# Patient Record
Sex: Female | Born: 1945 | Race: White | Hispanic: No | Smoking: Current every day smoker
Health system: Southern US, Community
[De-identification: ages and names within clinical notes are randomized; demographics above are authoritative.]

## PROBLEM LIST (undated history)

## (undated) DIAGNOSIS — E7800 Pure hypercholesterolemia, unspecified: Secondary | ICD-10-CM

## (undated) DIAGNOSIS — I10 Essential (primary) hypertension: Secondary | ICD-10-CM

---

## 2011-10-29 ENCOUNTER — Encounter (HOSPITAL_COMMUNITY): Payer: Self-pay | Admitting: *Deleted

## 2011-10-29 ENCOUNTER — Encounter (HOSPITAL_COMMUNITY): Payer: Self-pay | Admitting: Anesthesiology

## 2011-10-29 ENCOUNTER — Encounter (HOSPITAL_COMMUNITY)
Admission: EM | Disposition: A | Discharge status: Home / Self Care | Payer: Self-pay | Source: Home / Self Care | Attending: Emergency Medicine

## 2011-10-29 ENCOUNTER — Emergency Department (HOSPITAL_COMMUNITY)
Admission: EM | Admit: 2011-10-29 | Discharge: 2011-10-29 | Disposition: A | Discharge status: Home / Self Care | Payer: Managed Care, Other (non HMO) | Attending: Emergency Medicine | Admitting: Emergency Medicine

## 2011-10-29 ENCOUNTER — Emergency Department (HOSPITAL_COMMUNITY): Payer: Managed Care, Other (non HMO) | Admitting: Anesthesiology

## 2011-10-29 DIAGNOSIS — L0390 Cellulitis, unspecified: Secondary | ICD-10-CM

## 2011-10-29 DIAGNOSIS — S6990XA Unspecified injury of unspecified wrist, hand and finger(s), initial encounter: Secondary | ICD-10-CM | POA: Insufficient documentation

## 2011-10-29 DIAGNOSIS — M7989 Other specified soft tissue disorders: Secondary | ICD-10-CM | POA: Insufficient documentation

## 2011-10-29 DIAGNOSIS — I10 Essential (primary) hypertension: Secondary | ICD-10-CM | POA: Insufficient documentation

## 2011-10-29 DIAGNOSIS — M79609 Pain in unspecified limb: Secondary | ICD-10-CM | POA: Insufficient documentation

## 2011-10-29 DIAGNOSIS — Z79899 Other long term (current) drug therapy: Secondary | ICD-10-CM | POA: Insufficient documentation

## 2011-10-29 DIAGNOSIS — W540XXA Bitten by dog, initial encounter: Secondary | ICD-10-CM | POA: Insufficient documentation

## 2011-10-29 DIAGNOSIS — L02519 Cutaneous abscess of unspecified hand: Secondary | ICD-10-CM | POA: Insufficient documentation

## 2011-10-29 DIAGNOSIS — S61209A Unspecified open wound of unspecified finger without damage to nail, initial encounter: Secondary | ICD-10-CM | POA: Insufficient documentation

## 2011-10-29 DIAGNOSIS — E7800 Pure hypercholesterolemia, unspecified: Secondary | ICD-10-CM | POA: Insufficient documentation

## 2011-10-29 DIAGNOSIS — Y92009 Unspecified place in unspecified non-institutional (private) residence as the place of occurrence of the external cause: Secondary | ICD-10-CM | POA: Insufficient documentation

## 2011-10-29 DIAGNOSIS — L03019 Cellulitis of unspecified finger: Secondary | ICD-10-CM | POA: Insufficient documentation

## 2011-10-29 HISTORY — PX: I & D EXTREMITY: SHX5045

## 2011-10-29 HISTORY — DX: Pure hypercholesterolemia, unspecified: E78.00

## 2011-10-29 HISTORY — DX: Essential (primary) hypertension: I10

## 2011-10-29 LAB — COMPREHENSIVE METABOLIC PANEL
ALT: 16 U/L (ref 0–35)
AST: 17 U/L (ref 0–37)
Alkaline Phosphatase: 86 U/L (ref 39–117)
CO2: 29 mEq/L (ref 19–32)
Chloride: 102 mEq/L (ref 96–112)
GFR calc Af Amer: >90 mL/min (ref >90)
GFR calc non Af Amer: >90 mL/min (ref >90)
Glucose, Bld: 113 mg/dL — ABNORMAL HIGH (ref 70–99)
Potassium: 4.6 mEq/L (ref 3.5–5.1)
Sodium: 141 mEq/L (ref 135–145)

## 2011-10-29 LAB — CBC
MCV: 94.4 fL (ref 78.0–100.0)
Platelets: 211 10*3/uL (ref 150–400)
RBC: 4.26 MIL/uL (ref 3.87–5.11)
WBC: 6.6 10*3/uL (ref 4.0–10.5)

## 2011-10-29 LAB — GRAM STAIN

## 2011-10-29 LAB — DIFFERENTIAL
Basophils Absolute: 0.1 10*3/uL (ref 0.0–0.1)
Lymphocytes Relative: 26 % (ref 12–46)
Lymphs Abs: 1.7 10*3/uL (ref 0.7–4.0)
Neutro Abs: 4 10*3/uL (ref 1.7–7.7)
Neutrophils Relative %: 61 % (ref 43–77)

## 2011-10-29 SURGERY — IRRIGATION AND DEBRIDEMENT EXTREMITY
Anesthesia: General | Site: Hand | Laterality: Left | Wound class: Dirty or Infected

## 2011-10-29 MED ORDER — MIDAZOLAM HCL 5 MG/5ML IJ SOLN
INTRAMUSCULAR | Status: DC | PRN
  Administered 2011-10-29: 2 mg via INTRAVENOUS

## 2011-10-29 MED ORDER — MORPHINE SULFATE 2 MG/ML IJ SOLN: 0.0500 mg/kg | INTRAMUSCULAR | Status: DC | PRN

## 2011-10-29 MED ORDER — HYDROMORPHONE HCL PF 1 MG/ML IJ SOLN
INTRAMUSCULAR | Status: AC
  Filled 2011-10-29: qty 1

## 2011-10-29 MED ORDER — PROPOFOL 10 MG/ML IV BOLUS
INTRAVENOUS | Status: DC | PRN
  Administered 2011-10-29: 40 mg via INTRAVENOUS
  Administered 2011-10-29: 140 mg via INTRAVENOUS

## 2011-10-29 MED ORDER — SODIUM CHLORIDE 0.9 % IV SOLN
3.0000 g | Freq: Once | INTRAVENOUS | Status: AC
  Administered 2011-10-29: 3 g via INTRAVENOUS
  Filled 2011-10-29: qty 3

## 2011-10-29 MED ORDER — FENTANYL CITRATE 0.05 MG/ML IJ SOLN
INTRAMUSCULAR | Status: DC | PRN
  Administered 2011-10-29: 125 ug via INTRAVENOUS

## 2011-10-29 MED ORDER — SODIUM CHLORIDE 0.9 % IR SOLN
Status: DC | PRN
  Administered 2011-10-29: 1000 mL

## 2011-10-29 MED ORDER — ONDANSETRON HCL 4 MG/2ML IJ SOLN
INTRAMUSCULAR | Status: AC
  Filled 2011-10-29: qty 2

## 2011-10-29 MED ORDER — SUCCINYLCHOLINE CHLORIDE 20 MG/ML IJ SOLN
INTRAMUSCULAR | Status: DC | PRN
  Administered 2011-10-29: 100 mg via INTRAVENOUS

## 2011-10-29 MED ORDER — OXYCODONE-ACETAMINOPHEN 5-325 MG PO TABS: 1.0000 | ORAL_TABLET | ORAL | Status: AC | PRN

## 2011-10-29 MED ORDER — LIDOCAINE HCL (CARDIAC) 20 MG/ML IV SOLN
INTRAVENOUS | Status: DC | PRN
  Administered 2011-10-29: 60 mg via INTRAVENOUS

## 2011-10-29 MED ORDER — AMOXICILLIN-POT CLAVULANATE 875-125 MG PO TABS: 1.0000 | ORAL_TABLET | Freq: Two times a day (BID) | ORAL | Status: AC

## 2011-10-29 MED ORDER — HYDROMORPHONE HCL PF 1 MG/ML IJ SOLN
0.2500 mg | INTRAMUSCULAR | Status: DC | PRN
  Administered 2011-10-29: 0.5 mg via INTRAVENOUS
  Administered 2011-10-29: 0.25 mg via INTRAVENOUS
  Administered 2011-10-29: 0.5 mg via INTRAVENOUS
  Administered 2011-10-29: 0.25 mg via INTRAVENOUS

## 2011-10-29 MED ORDER — SODIUM CHLORIDE 0.9 % IV SOLN
INTRAVENOUS | Status: DC | PRN
  Administered 2011-10-29: 19:00:00 via INTRAVENOUS

## 2011-10-29 MED ORDER — PROMETHAZINE HCL 12.5 MG PO TABS: 12.5000 mg | ORAL_TABLET | Freq: Four times a day (QID) | ORAL | Status: AC | PRN

## 2011-10-29 MED ORDER — ONDANSETRON HCL 4 MG/2ML IJ SOLN
4.0000 mg | Freq: Once | INTRAMUSCULAR | Status: AC | PRN
  Administered 2011-10-29: 4 mg via INTRAVENOUS

## 2011-10-29 MED ORDER — BUPIVACAINE HCL (PF) 0.25 % IJ SOLN
INTRAMUSCULAR | Status: DC | PRN
  Administered 2011-10-29: 10 mL

## 2011-10-29 MED ORDER — LACTATED RINGERS IV SOLN
INTRAVENOUS | Status: DC | PRN
  Administered 2011-10-29: 20:00:00 via INTRAVENOUS

## 2011-10-29 SURGICAL SUPPLY — 51 items
BANDAGE CONFORM 2  STR LF (GAUZE/BANDAGES/DRESSINGS) IMPLANT
BANDAGE ELASTIC 3 VELCRO ST LF (GAUZE/BANDAGES/DRESSINGS) IMPLANT
BANDAGE ELASTIC 4 VELCRO ST LF (GAUZE/BANDAGES/DRESSINGS) ×2 IMPLANT
BANDAGE GAUZE ELAST BULKY 4 IN (GAUZE/BANDAGES/DRESSINGS) ×2 IMPLANT
BNDG CMPR 9X4 STRL LF SNTH (GAUZE/BANDAGES/DRESSINGS) ×1
BNDG ESMARK 4X9 LF (GAUZE/BANDAGES/DRESSINGS) ×2 IMPLANT
CLOTH BEACON ORANGE TIMEOUT ST (SAFETY) ×2 IMPLANT
CORDS BIPOLAR (ELECTRODE) ×2 IMPLANT
COVER SURGICAL LIGHT HANDLE (MISCELLANEOUS) ×2 IMPLANT
CUFF TOURNIQUET SINGLE 18IN (TOURNIQUET CUFF) ×2 IMPLANT
DECANTER SPIKE VIAL GLASS SM (MISCELLANEOUS) ×2 IMPLANT
DRAIN PENROSE 1/4X12 LTX STRL (WOUND CARE) IMPLANT
DRAPE OEC MINIVIEW 54X84 (DRAPES) IMPLANT
DRAPE SURG 17X23 STRL (DRAPES) ×2 IMPLANT
DRSG PAD ABDOMINAL 8X10 ST (GAUZE/BANDAGES/DRESSINGS) IMPLANT
DURAPREP 26ML APPLICATOR (WOUND CARE) IMPLANT
ELECT REM PT RETURN 9FT ADLT (ELECTROSURGICAL)
ELECTRODE REM PT RTRN 9FT ADLT (ELECTROSURGICAL) IMPLANT
GAUZE PACKING IODOFORM 1/4X5 (PACKING) IMPLANT
GAUZE XEROFORM 1X8 LF (GAUZE/BANDAGES/DRESSINGS) ×2 IMPLANT
GLOVE BIO SURGEON STRL SZ8.5 (GLOVE) ×2 IMPLANT
GOWN SRG XL XLNG 56XLVL 4 (GOWN DISPOSABLE) IMPLANT
GOWN STRL NON-REIN LRG LVL3 (GOWN DISPOSABLE) ×2 IMPLANT
GOWN STRL NON-REIN XL XLG LVL4 (GOWN DISPOSABLE)
HANDPIECE INTERPULSE COAX TIP (DISPOSABLE)
KIT BASIN OR (CUSTOM PROCEDURE TRAY) ×2 IMPLANT
KIT ROOM TURNOVER OR (KITS) ×2 IMPLANT
MANIFOLD NEPTUNE II (INSTRUMENTS) ×2 IMPLANT
NEEDLE HYPO 25GX1X1/2 BEV (NEEDLE) IMPLANT
NEEDLE HYPO 25X1 1.5 SAFETY (NEEDLE) ×2 IMPLANT
NS IRRIG 1000ML POUR BTL (IV SOLUTION) ×2 IMPLANT
PACK ORTHO EXTREMITY (CUSTOM PROCEDURE TRAY) ×2 IMPLANT
PAD ARMBOARD 7.5X6 YLW CONV (MISCELLANEOUS) ×4 IMPLANT
PAD CAST 4YDX4 CTTN HI CHSV (CAST SUPPLIES) ×1 IMPLANT
PADDING CAST ABS 4INX4YD NS (CAST SUPPLIES) ×1
PADDING CAST ABS COTTON 4X4 ST (CAST SUPPLIES) ×1 IMPLANT
PADDING CAST COTTON 4X4 STRL (CAST SUPPLIES) ×2
SET HNDPC FAN SPRY TIP SCT (DISPOSABLE) IMPLANT
SPONGE GAUZE 4X4 12PLY (GAUZE/BANDAGES/DRESSINGS) ×2 IMPLANT
SPONGE LAP 18X18 X RAY DECT (DISPOSABLE) ×2 IMPLANT
SUCTION FRAZIER TIP 10 FR DISP (SUCTIONS) ×2 IMPLANT
SUT VICRYL RAPIDE 4/0 PS 2 (SUTURE) ×2 IMPLANT
SYR 20CC LL (SYRINGE) ×2 IMPLANT
SYR CONTROL 10ML LL (SYRINGE) ×2 IMPLANT
TOWEL OR 17X24 6PK STRL BLUE (TOWEL DISPOSABLE) ×2 IMPLANT
TOWEL OR 17X26 10 PK STRL BLUE (TOWEL DISPOSABLE) ×2 IMPLANT
TUBE ANAEROBIC SPECIMEN COL (MISCELLANEOUS) ×2 IMPLANT
TUBE CONNECTING 12X1/4 (SUCTIONS) ×2 IMPLANT
UNDERPAD 30X30 INCONTINENT (UNDERPADS AND DIAPERS) ×2 IMPLANT
WATER STERILE IRR 1000ML POUR (IV SOLUTION) ×2 IMPLANT
YANKAUER SUCT BULB TIP NO VENT (SUCTIONS) IMPLANT

## 2011-10-29 NOTE — ED Provider Notes (Signed)
History     CSN: [id number]  Arrival date & time 10/29/11  1236   First MD Initiated Contact with Patient 10/29/11 1652      Chief Complaint  Patient presents with  . Animal Bite    (Consider location/radiation/quality/duration/timing/severity/associated sxs/prior treatment) Patient is a 66 y.o. female presenting with animal bite. The history is provided by the patient (Patient states she was bit on the left hand by her dog yesterday.). No language interpreter was used.  Animal Bite  The incident occurred yesterday. The incident occurred at home. She came to the ER via personal transport. Head/neck injury location: hand. There is an injury to the left hand. The pain is mild. It is unlikely that a foreign body is present. Pertinent negatives include no chest pain, no abdominal pain, no headaches, no seizures and no cough.    Past Medical History  Diagnosis Date  . High cholesterol   . Hypertension     History reviewed. No pertinent past surgical history.  History reviewed. No pertinent family history.  History  Substance Use Topics  . Smoking status: Current Everyday Smoker  . Smokeless tobacco: Not on file  . Alcohol Use: No    OB History    Grav Para Term Preterm Abortions TAB SAB Ect Mult Living                  Review of Systems  Constitutional: Negative for fatigue.  HENT: Negative for congestion, sinus pressure and ear discharge.   Eyes: Negative for discharge.  Respiratory: Negative for cough.   Cardiovascular: Negative for chest pain.  Gastrointestinal: Negative for abdominal pain and diarrhea.  Genitourinary: Negative for frequency and hematuria.  Musculoskeletal: Negative for back pain.       Hand injury  Skin: Negative for rash.  Neurological: Negative for seizures and headaches.  Hematological: Negative.   Psychiatric/Behavioral: Negative for hallucinations.    Allergies  Sulfa antibiotics  Home Medications   Current Outpatient Rx  Name  Route Sig Dispense Refill  . EZETIMIBE-SIMVASTATIN 10-40 MG PO TABS Oral Take 1 tablet by mouth daily.    Marland Kitchen HYDROCHLOROTHIAZIDE 12.5 MG PO CAPS Oral Take 12.5 mg by mouth daily.    Marland Kitchen LEVOTHYROXINE SODIUM 88 MCG PO TABS Oral Take 88 mcg by mouth daily.    Carma Leaven M PLUS PO TABS Oral Take 1 tablet by mouth daily.      BP 137/64  Pulse 76  Temp(Src) 98.2 F (36.8 C) (Oral)  Resp 18  SpO2 96%  Physical Exam  Constitutional: She is oriented to person, place, and time. She appears well-developed.  HENT:  Head: Normocephalic and atraumatic.  Eyes: Conjunctivae and EOM are normal. No scleral icterus.  Neck: Neck supple. No thyromegaly present.  Cardiovascular: Normal rate and regular rhythm.  Exam reveals no gallop and no friction rub.   No murmur heard. Pulmonary/Chest: No stridor. She has no wheezes. She has no rales. She exhibits no tenderness.  Abdominal: She exhibits no distension. There is no tenderness. There is no rebound.  Musculoskeletal:       Patient had a bite to her left small finger. And now has redness extending up her hand. Neurovascular exam normal  Lymphadenopathy:    She has no cervical adenopathy.  Neurological: She is oriented to person, place, and time. Coordination normal.  Skin: No rash noted. No erythema.  Psychiatric: She has a normal mood and affect. Her behavior is normal.    ED Course  Procedures (  including critical care time)   Labs Reviewed  CBC  DIFFERENTIAL  COMPREHENSIVE METABOLIC PANEL   No results found.   1. Cellulitis     Hand to admit  MDM          Benny Lennert, MD 10/29/11 [phone]

## 2011-10-29 NOTE — ED Notes (Addendum)
Pt was bite on left anterior pinky finger yesterday around 430pm, 2 puncture wounds noted finger with swelling and drainage. Warm to the touch. Dog is up to date on shots.

## 2011-10-29 NOTE — Anesthesia Postprocedure Evaluation (Signed)
  Anesthesia Post-op Note  Patient: Stephanie Sharp  Procedure(s) Performed: Procedure(s) (LRB): IRRIGATION AND DEBRIDEMENT EXTREMITY (Left)  Patient Location: PACU  Anesthesia Type: General  Level of Consciousness: awake, alert  and oriented  Airway and Oxygen Therapy: Patient Spontanous Breathing  Post-op Pain: mild  Post-op Assessment: Post-op Vital signs reviewed, Patient's Cardiovascular Status Stable, Respiratory Function Stable, Patent Airway, No signs of Nausea or vomiting and Pain level controlled  Post-op Vital Signs: Reviewed and stable  Complications: No apparent anesthesia complications

## 2011-10-29 NOTE — Consult Note (Signed)
Reason for Consult:dog bite left small finger Referring Physician: Zammitt  Stephanie Sharp is an 66 y.o. female.  HPI: s/p dog bite 24 hours ago with worsening pain swelling and tenderness over left small finger  Past Medical History  Diagnosis Date  . High cholesterol   . Hypertension     History reviewed. No pertinent past surgical history.  History reviewed. No pertinent family history.  Social History:  reports that she has been smoking.  She does not have any smokeless tobacco history on file. She reports that she does not drink alcohol. Her drug history not on file.  Allergies:  Allergies  Allergen Reactions  . Sulfa Antibiotics Swelling    Medications: I have reviewed the patient's current medications.  No results found for this or any previous visit (from the past 48 hour(s)).  No results found.  Review of Systems  Constitutional: Negative.   HENT: Negative.   Eyes: Negative.   Respiratory: Negative.   Cardiovascular: Negative.   Gastrointestinal: Negative.   Genitourinary: Negative.   Musculoskeletal: Negative.   Skin: Negative.   Neurological: Negative.   Endo/Heme/Allergies: Negative.   Psychiatric/Behavioral: Negative.    Blood pressure 137/64, pulse 76, temperature 98.2 F (36.8 C), temperature source Oral, resp. rate 18, SpO2 96.00%. Physical Exam  Constitutional: She is oriented to person, place, and time. She appears well-developed and well-nourished.  HENT:  Head: Normocephalic and atraumatic.  Cardiovascular: Normal rate.   Respiratory: Effort normal.  Musculoskeletal:       Hands: Neurological: She is alert and oriented to person, place, and time.  Skin: Skin is warm.  Psychiatric: She has a normal mood and affect. Her speech is normal and behavior is normal. Thought content normal.    Assessment/Plan: 66 y/o female with probable infected left small finger flexor sheath from dog bite  Plan I and D  Dat Derksen A 10/29/2011, 5:13  PM

## 2011-10-29 NOTE — Op Note (Signed)
See dictataed note [phone]

## 2011-10-29 NOTE — Brief Op Note (Signed)
10/29/2011  8:08 PM  PATIENT:  Idelia Salm  66 y.o. female  PRE-OPERATIVE DIAGNOSIS:  Infected Dog bite to Left small Finger  POST-OPERATIVE DIAGNOSIS:  * No post-op diagnosis entered *  PROCEDURE:  Procedure(s) (LRB): IRRIGATION AND DEBRIDEMENT EXTREMITY (Left)  SURGEON:  Surgeon(s) and Role:    * Marlowe Shores, MD - Primary  PHYSICIAN ASSISTANT:   ASSISTANTS: none   ANESTHESIA:   general  EBL:  Total I/O In: 1000 [I.V.:1000] Out: -   BLOOD ADMINISTERED:none  DRAINS: none   LOCAL MEDICATIONS USED:  MARCAINE   5cc  SPECIMEN:  No Specimen  DISPOSITION OF SPECIMEN:  N/A  COUNTS:  YES  TOURNIQUET:  * Missing tourniquet times found for documented tourniquets in log:  [id number] *  DICTATION: .Other Dictation: Dictation Number [id number]  PLAN OF CARE: Discharge to home after PACU  PATIENT DISPOSITION:  PACU - hemodynamically stable.   Delay start of Pharmacological VTE agent (>24hrs) due to surgical blood loss or risk of bleeding: not applicable

## 2011-10-29 NOTE — Discharge Instructions (Signed)
Abscess An abscess (boil or furuncle) is an infected area that contains a collection of pus.  SYMPTOMS Signs and symptoms of an abscess include pain, tenderness, redness, or hardness. You may feel a moveable soft area under your skin. An abscess can occur anywhere in the body.  TREATMENT  A surgical cut (incision) may be made over your abscess to drain the pus. Gauze may be packed into the space or a drain may be looped through the abscess cavity (pocket). This provides a drain that will allow the cavity to heal from the inside outwards. The abscess may be painful for a few days, but should feel much better if it was drained.  Your abscess, if seen early, may not have localized and may not have been drained. If not, another appointment may be required if it does not get better on its own or with medications. HOME CARE INSTRUCTIONS   Only take over-the-counter or prescription medicines for pain, discomfort, or fever as directed by your caregiver.   Take your antibiotics as directed if they were prescribed. Finish them even if you start to feel better.   Keep the skin and clothes clean around your abscess.   If the abscess was drained, you will need to use gauze dressing to collect any draining pus. Dressings will typically need to be changed 3 or more times a day.   The infection may spread by skin contact with others. Avoid skin contact as much as possible.   Practice good hygiene. This includes regular hand washing, cover any draining skin lesions, and do not share personal care items.   If you participate in sports, do not share athletic equipment, towels, whirlpools, or personal care items. Shower after every practice or tournament.   If a draining area cannot be adequately covered:   Do not participate in sports.   Children should not participate in day care until the wound has healed or drainage stops.   If your caregiver has given you a follow-up appointment, it is very important  to keep that appointment. Not keeping the appointment could result in a much worse infection, chronic or permanent injury, pain, and disability. If there is any problem keeping the appointment, you must call back to this facility for assistance.  SEEK MEDICAL CARE IF:   You develop increased pain, swelling, redness, drainage, or bleeding in the wound site.   You develop signs of generalized infection including muscle aches, chills, fever, or a general ill feeling.   You have an oral temperature above 102 F (38.9 C).  MAKE SURE YOU:   Understand these instructions.   Will watch your condition.   Will get help right away if you are not doing well or get worse.  Document Released: 04/14/2005 Document Revised: 06/24/2011 Document Reviewed: 02/06/2008 Exit[employer] Patient Information 2012 ExitCare, LLCAnimal Bite An animal bite can result in a scratch on the skin, deep open cut, puncture of the skin, crush injury, or tearing away of the skin or a body part. Dogs are responsible for most animal bites. Children are bitten more often than adults. An animal bite can range from very mild to more serious. A small bite from your house pet is no cause for alarm. However, some animal bites can become infected or injure a bone or other tissue. You must seek medical care if:  The skin is broken and bleeding does not slow down or stop after 15 minutes.   The puncture is deep and difficult to clean (such  as a cat bite).   Pain, warmth, redness, or pus develops around the wound.   The bite is from a stray animal or rodent. There may be a risk of rabies infection.   The bite is from a snake, raccoon, skunk, fox, coyote, or bat. There may be a risk of rabies infection.   The person bitten has a chronic illness such as diabetes, liver disease, or cancer, or the person takes medicine that lowers the immune system.   There is concern about the location and severity of the bite.  It is important to clean and  protect an animal bite wound right away to prevent infection. Follow these steps:  Clean the wound with plenty of water and soap.   Apply an antibiotic cream.   Apply gentle pressure over the wound with a clean towel or gauze to slow or stop bleeding.   Elevate the affected area above the heart to help stop any bleeding.   Seek medical care. Getting medical care within 8 hours of the animal bite leads to the best possible outcome.  DIAGNOSIS  Your caregiver will most likely:  Take a detailed history of the animal and the bite injury.   Perform a wound exam.   Take your medical history.  Blood tests or X-rays may be performed. Sometimes, infected bite wounds are cultured and sent to a lab to identify the infectious bacteria.  TREATMENT  Medical treatment will depend on the location and type of animal bite as well as the patient's medical history. Treatment may include:  Wound care, such as cleaning and flushing the wound with saline solution, bandaging, and elevating the affected area.   Antibiotics.   Tetanus immunization.   Rabies immunization.   Leaving the wound open to heal. This is often done with animal bites, due to the high risk of infection. However, in certain cases, wound closure with stitches, wound adhesive, skin adhesive strips, or staples may be used.  Infected bites that are left untreated may require intravenous (IV) antibiotics and surgical treatment in the hospital. HOME CARE INSTRUCTIONS  Follow your caregiver's instructions for wound care.   Take all medicines as directed.   If your caregiver prescribes antibiotics, take them as directed. Finish them even if you start to feel better.   Follow up with your caregiver for further exams or immunizations as directed.  You may need a tetanus shot if:  You cannot remember when you had your last tetanus shot.   You have never had a tetanus shot.   The injury broke your skin.  If you get a tetanus shot,  your arm may swell, get red, and feel warm to the touch. This is common and not a problem. If you need a tetanus shot and you choose not to have one, there is a rare chance of getting tetanus. Sickness from tetanus can be serious. SEEK MEDICAL CARE IF:  You notice warmth, redness, soreness, swelling, pus discharge, or a bad smell coming from the wound.   You have a red line on the skin coming from the wound.   You have a fever, chills, or a general ill feeling.   You have nausea or vomiting.   You have continued or worsening pain.   You have trouble moving the injured part.   You have other questions or concerns.  MAKE SURE YOU:  Understand these instructions.   Will watch your condition.   Will get help right away if you are not  doing well or get worse.  Document Released: 03/23/2011 Document Revised: 06/24/2011 Document Reviewed: 03/23/2011 [facility] Patient Information 2012 [place], [place].Marland Kitchen

## 2011-10-29 NOTE — Anesthesia Procedure Notes (Signed)
Procedure Name: Intubation Date/Time: 10/29/2011 7:38 PM Performed by: Alanda Amass A Pre-anesthesia Checklist: Patient identified, Timeout performed, Emergency Drugs available, Suction available and Patient being monitored Patient Re-evaluated:Patient Re-evaluated prior to inductionOxygen Delivery Method: Circle system utilized Preoxygenation: Pre-oxygenation with 100% oxygen Intubation Type: IV induction, Rapid sequence and Cricoid Pressure applied Laryngoscope Size: Mac and 3 Grade View: Grade I Tube type: Oral Tube size: 7.5 mm Number of attempts: 1 Airway Equipment and Method: Stylet Placement Confirmation: ETT inserted through vocal cords under direct vision,  breath sounds checked- equal and bilateral and positive ETCO2 Secured at: 21 cm Tube secured with: Tape Dental Injury: Teeth and Oropharynx as per pre-operative assessment

## 2011-10-29 NOTE — Anesthesia Preprocedure Evaluation (Addendum)
Anesthesia Evaluation  Patient identified by MRN, date of birth, ID band Patient awake    Reviewed: Allergy & Precautions, H&P , NPO status , Patient's Chart, lab work & pertinent test results  Airway Mallampati: I TM Distance: >3 FB Neck ROM: Full    Dental  (+) Teeth Intact and Dental Advisory Given   Pulmonary  breath sounds clear to auscultation        Cardiovascular Rhythm:Regular Rate:Normal     Neuro/Psych    GI/Hepatic   Endo/Other  Hypothyroidism   Renal/GU      Musculoskeletal   Abdominal   Peds  Hematology   Anesthesia Other Findings   Reproductive/Obstetrics                          Anesthesia Physical Anesthesia Plan  ASA: II and Emergent  Anesthesia Plan: General   Post-op Pain Management:    Induction: Intravenous and Rapid sequence  Airway Management Planned: Oral ETT  Additional Equipment:   Intra-op Plan:   Post-operative Plan: Extubation in OR  Informed Consent: I have reviewed the patients History and Physical, chart, labs and discussed the procedure including the risks, benefits and alternatives for the proposed anesthesia with the patient or authorized representative who has indicated his/her understanding and acceptance.   Dental advisory given  Plan Discussed with: CRNA, Anesthesiologist and Surgeon  Anesthesia Plan Comments: (Pt had a "handful" of peanuts around 1530. Dr. Mina Marble feels this is emergent and wants to proceed.  Risks of aspiration are higher in such a patient and this was explained to her.  She seemed to understand the need for a rapid sequence induction and Intubation.)        Anesthesia Quick Evaluation

## 2011-10-29 NOTE — Preoperative (Signed)
Beta Blockers   Reason not to administer Beta Blockers:Not Applicable 

## 2011-10-29 NOTE — Progress Notes (Signed)
Pt reports continued nausea, has scant emesis, clear, suggested to patient to call md to stay overnight, pt refuses, states she wants to go home now to sleep, daughter at bedside

## 2011-10-29 NOTE — Transfer of Care (Signed)
Immediate Anesthesia Transfer of Care Note  Patient: Stephanie Sharp  Procedure(s) Performed: Procedure(s) (LRB): IRRIGATION AND DEBRIDEMENT EXTREMITY (Left)  Patient Location: PACU  Anesthesia Type: General  Level of Consciousness: sedated  Airway & Oxygen Therapy: Patient Spontanous Breathing  Post-op Assessment: Report given to PACU RN and Post -op Vital signs reviewed and stable  Post vital signs: Reviewed and stable  Complications: No apparent anesthesia complications

## 2011-10-29 NOTE — ED Notes (Signed)
reddness marked with site marker.

## 2011-10-30 NOTE — Op Note (Signed)
NAMEALEXANDRA, Stephanie Sharp NO.:  [id number]  MEDICAL RECORD NO.:  [id number]  LOCATION:  [facility]                         FACILITY:  [facility]  PHYSICIAN:  Artist Pais. Kahlel Peake, M.D.DATE OF BIRTH:  October 27, 1945  DATE OF PROCEDURE:  10/29/2011 DATE OF DISCHARGE:  10/29/2011                              OPERATIVE REPORT   PREOPERATIVE DIAGNOSIS:  Infected left small finger flexor sheath and proximal interphalangeal joint, status post dog bite.  POSTOPERATIVE DIAGNOSIS:  Infected left small finger flexor sheath and proximal interphalangeal joint, status post dog bite.  PROCEDURE:  Incision and drainage, left small finger proximal interphalangeal joint and left small finger flexor sheath.  SURGEON:  Artist Pais. Mina Marble, MD  ASSISTANT:  None.  ANESTHESIA:  General.  COMPLICATION:  None.  DRAINS:  Vessel loops were placed as drains.  DESCRIPTION OF PROCEDURE:  The patient was taken to the operating suite. After induction of adequate general anesthesia, left upper extremity was prepped and draped in usual sterile fashion.  An Esmarch was used to exsanguinate the limb.  Tourniquet was inflated to 250 mmHg.  At this point in time, two bite wounds over the PIP joint of the small finger, left side were opened and dissection was carried down to the extensor mechanism.  Interval between the extensor mechanism and the lateral band was dissected down to the joint, the joint was entered.  There was cloudy fluid in the joint, it was thoroughly irrigated.  A second incision was made over the proximal phalanx where the bite wound was opened with spreading technique.  There was purulence encountered in the flexor sheath.  A transverse incision was made over the A1 pulley area as well as a midlateral incision on the ulnar side of the DIP joint level and a #5 pediatric feeding tube was placed into the flexor sheath, which was thoroughly irrigated with 500 mL of normal saline, it was  then removed.  Vessel loops were placed between the proximal phalangeal and A1 pulley incisions palmarly and the proximal phalangeal and midlateral incision distally.  The wound was then dressed with Xeroform, 4x4s, fluffs, and a compressive dressing.  The patient tolerated the procedure well, went to recovery room in stable fashion.     Artist Pais Mina Marble, M.D.     MAW/MEDQ  D:  10/29/2011  T:  10/30/2011  Job:  [id number]

## 2011-11-01 ENCOUNTER — Encounter (HOSPITAL_COMMUNITY): Payer: Self-pay | Admitting: Orthopedic Surgery

## 2011-11-01 LAB — WOUND CULTURE

## 2011-11-03 LAB — ANAEROBIC CULTURE

## 2014-09-12 DIAGNOSIS — D485 Neoplasm of uncertain behavior of skin: Secondary | ICD-10-CM | POA: Diagnosis not present

## 2014-09-12 DIAGNOSIS — Z0000 Encounter for general adult medical examination without abnormal findings: Secondary | ICD-10-CM | POA: Diagnosis not present

## 2014-09-12 DIAGNOSIS — R609 Edema, unspecified: Secondary | ICD-10-CM | POA: Diagnosis not present

## 2014-09-12 DIAGNOSIS — Z8739 Personal history of other diseases of the musculoskeletal system and connective tissue: Secondary | ICD-10-CM | POA: Diagnosis not present

## 2014-09-12 DIAGNOSIS — M8580 Other specified disorders of bone density and structure, unspecified site: Secondary | ICD-10-CM | POA: Diagnosis not present

## 2014-09-12 DIAGNOSIS — Z85820 Personal history of malignant melanoma of skin: Secondary | ICD-10-CM | POA: Diagnosis not present

## 2014-09-12 DIAGNOSIS — E039 Hypothyroidism, unspecified: Secondary | ICD-10-CM | POA: Diagnosis not present

## 2014-09-12 DIAGNOSIS — F17210 Nicotine dependence, cigarettes, uncomplicated: Secondary | ICD-10-CM | POA: Diagnosis not present

## 2014-09-12 DIAGNOSIS — E785 Hyperlipidemia, unspecified: Secondary | ICD-10-CM | POA: Diagnosis not present

## 2014-09-26 DIAGNOSIS — E785 Hyperlipidemia, unspecified: Secondary | ICD-10-CM | POA: Diagnosis not present

## 2014-09-26 DIAGNOSIS — L430 Hypertrophic lichen planus: Secondary | ICD-10-CM | POA: Diagnosis not present

## 2014-09-26 DIAGNOSIS — D485 Neoplasm of uncertain behavior of skin: Secondary | ICD-10-CM | POA: Diagnosis not present

## 2014-10-02 DIAGNOSIS — M859 Disorder of bone density and structure, unspecified: Secondary | ICD-10-CM | POA: Diagnosis not present

## 2014-10-02 DIAGNOSIS — M8580 Other specified disorders of bone density and structure, unspecified site: Secondary | ICD-10-CM | POA: Diagnosis not present

## 2014-10-07 DIAGNOSIS — M8580 Other specified disorders of bone density and structure, unspecified site: Secondary | ICD-10-CM | POA: Diagnosis not present

## 2014-10-07 DIAGNOSIS — D485 Neoplasm of uncertain behavior of skin: Secondary | ICD-10-CM | POA: Diagnosis not present

## 2015-03-13 DIAGNOSIS — E039 Hypothyroidism, unspecified: Secondary | ICD-10-CM | POA: Diagnosis not present

## 2015-03-13 DIAGNOSIS — E785 Hyperlipidemia, unspecified: Secondary | ICD-10-CM | POA: Diagnosis not present

## 2015-03-13 DIAGNOSIS — R609 Edema, unspecified: Secondary | ICD-10-CM | POA: Diagnosis not present

## 2015-03-13 DIAGNOSIS — Z23 Encounter for immunization: Secondary | ICD-10-CM | POA: Diagnosis not present

## 2015-03-13 DIAGNOSIS — F17210 Nicotine dependence, cigarettes, uncomplicated: Secondary | ICD-10-CM | POA: Diagnosis not present

## 2015-03-13 DIAGNOSIS — M8588 Other specified disorders of bone density and structure, other site: Secondary | ICD-10-CM | POA: Diagnosis not present

## 2015-03-13 DIAGNOSIS — Z79899 Other long term (current) drug therapy: Secondary | ICD-10-CM | POA: Diagnosis not present

## 2015-04-03 DIAGNOSIS — L03113 Cellulitis of right upper limb: Secondary | ICD-10-CM | POA: Diagnosis not present

## 2015-09-16 DIAGNOSIS — R609 Edema, unspecified: Secondary | ICD-10-CM | POA: Diagnosis not present

## 2015-09-16 DIAGNOSIS — Z209 Contact with and (suspected) exposure to unspecified communicable disease: Secondary | ICD-10-CM | POA: Diagnosis not present

## 2015-09-16 DIAGNOSIS — F17210 Nicotine dependence, cigarettes, uncomplicated: Secondary | ICD-10-CM | POA: Diagnosis not present

## 2015-09-16 DIAGNOSIS — M8580 Other specified disorders of bone density and structure, unspecified site: Secondary | ICD-10-CM | POA: Diagnosis not present

## 2015-09-16 DIAGNOSIS — E785 Hyperlipidemia, unspecified: Secondary | ICD-10-CM | POA: Diagnosis not present

## 2015-09-16 DIAGNOSIS — E039 Hypothyroidism, unspecified: Secondary | ICD-10-CM | POA: Diagnosis not present

## 2015-09-16 DIAGNOSIS — Z0000 Encounter for general adult medical examination without abnormal findings: Secondary | ICD-10-CM | POA: Diagnosis not present

## 2015-09-16 DIAGNOSIS — D229 Melanocytic nevi, unspecified: Secondary | ICD-10-CM | POA: Diagnosis not present

## 2015-09-24 DIAGNOSIS — L820 Inflamed seborrheic keratosis: Secondary | ICD-10-CM | POA: Diagnosis not present

## 2015-09-24 DIAGNOSIS — D225 Melanocytic nevi of trunk: Secondary | ICD-10-CM | POA: Diagnosis not present

## 2015-09-24 DIAGNOSIS — L821 Other seborrheic keratosis: Secondary | ICD-10-CM | POA: Diagnosis not present

## 2015-12-10 DIAGNOSIS — E039 Hypothyroidism, unspecified: Secondary | ICD-10-CM | POA: Diagnosis not present

## 2016-02-18 DIAGNOSIS — L089 Local infection of the skin and subcutaneous tissue, unspecified: Secondary | ICD-10-CM | POA: Diagnosis not present

## 2016-03-23 DIAGNOSIS — R609 Edema, unspecified: Secondary | ICD-10-CM | POA: Diagnosis not present

## 2016-03-23 DIAGNOSIS — F17210 Nicotine dependence, cigarettes, uncomplicated: Secondary | ICD-10-CM | POA: Diagnosis not present

## 2016-03-23 DIAGNOSIS — Z23 Encounter for immunization: Secondary | ICD-10-CM | POA: Diagnosis not present

## 2016-03-23 DIAGNOSIS — Z86010 Personal history of colonic polyps: Secondary | ICD-10-CM | POA: Diagnosis not present

## 2016-03-23 DIAGNOSIS — E785 Hyperlipidemia, unspecified: Secondary | ICD-10-CM | POA: Diagnosis not present

## 2016-03-23 DIAGNOSIS — E039 Hypothyroidism, unspecified: Secondary | ICD-10-CM | POA: Diagnosis not present

## 2016-09-16 DIAGNOSIS — I10 Essential (primary) hypertension: Secondary | ICD-10-CM | POA: Diagnosis not present

## 2016-09-16 DIAGNOSIS — Z23 Encounter for immunization: Secondary | ICD-10-CM | POA: Diagnosis not present

## 2016-09-16 DIAGNOSIS — E039 Hypothyroidism, unspecified: Secondary | ICD-10-CM | POA: Diagnosis not present

## 2016-09-16 DIAGNOSIS — F17210 Nicotine dependence, cigarettes, uncomplicated: Secondary | ICD-10-CM | POA: Diagnosis not present

## 2016-09-16 DIAGNOSIS — E785 Hyperlipidemia, unspecified: Secondary | ICD-10-CM | POA: Diagnosis not present

## 2016-09-16 DIAGNOSIS — M8580 Other specified disorders of bone density and structure, unspecified site: Secondary | ICD-10-CM | POA: Diagnosis not present

## 2016-09-16 DIAGNOSIS — Z0000 Encounter for general adult medical examination without abnormal findings: Secondary | ICD-10-CM | POA: Diagnosis not present

## 2016-10-26 DIAGNOSIS — J0100 Acute maxillary sinusitis, unspecified: Secondary | ICD-10-CM | POA: Diagnosis not present

## 2016-10-26 DIAGNOSIS — J069 Acute upper respiratory infection, unspecified: Secondary | ICD-10-CM | POA: Diagnosis not present

## 2017-03-22 DIAGNOSIS — F17210 Nicotine dependence, cigarettes, uncomplicated: Secondary | ICD-10-CM | POA: Diagnosis not present

## 2017-03-22 DIAGNOSIS — R7301 Impaired fasting glucose: Secondary | ICD-10-CM | POA: Diagnosis not present

## 2017-03-22 DIAGNOSIS — Z23 Encounter for immunization: Secondary | ICD-10-CM | POA: Diagnosis not present

## 2017-03-22 DIAGNOSIS — E039 Hypothyroidism, unspecified: Secondary | ICD-10-CM | POA: Diagnosis not present

## 2017-03-22 DIAGNOSIS — E785 Hyperlipidemia, unspecified: Secondary | ICD-10-CM | POA: Diagnosis not present

## 2017-03-22 DIAGNOSIS — I10 Essential (primary) hypertension: Secondary | ICD-10-CM | POA: Diagnosis not present

## 2017-06-24 DIAGNOSIS — R7301 Impaired fasting glucose: Secondary | ICD-10-CM | POA: Diagnosis not present

## 2017-09-20 DIAGNOSIS — Z1389 Encounter for screening for other disorder: Secondary | ICD-10-CM | POA: Diagnosis not present

## 2017-09-20 DIAGNOSIS — E119 Type 2 diabetes mellitus without complications: Secondary | ICD-10-CM | POA: Diagnosis not present

## 2017-09-20 DIAGNOSIS — E039 Hypothyroidism, unspecified: Secondary | ICD-10-CM | POA: Diagnosis not present

## 2017-09-20 DIAGNOSIS — Z1211 Encounter for screening for malignant neoplasm of colon: Secondary | ICD-10-CM | POA: Diagnosis not present

## 2017-09-20 DIAGNOSIS — Z0000 Encounter for general adult medical examination without abnormal findings: Secondary | ICD-10-CM | POA: Diagnosis not present

## 2017-09-20 DIAGNOSIS — Z86010 Personal history of colonic polyps: Secondary | ICD-10-CM | POA: Diagnosis not present

## 2017-09-20 DIAGNOSIS — F17210 Nicotine dependence, cigarettes, uncomplicated: Secondary | ICD-10-CM | POA: Diagnosis not present

## 2017-09-20 DIAGNOSIS — H6123 Impacted cerumen, bilateral: Secondary | ICD-10-CM | POA: Diagnosis not present

## 2017-09-20 DIAGNOSIS — E785 Hyperlipidemia, unspecified: Secondary | ICD-10-CM | POA: Diagnosis not present

## 2017-09-20 DIAGNOSIS — G5702 Lesion of sciatic nerve, left lower limb: Secondary | ICD-10-CM | POA: Diagnosis not present

## 2017-09-20 DIAGNOSIS — I10 Essential (primary) hypertension: Secondary | ICD-10-CM | POA: Diagnosis not present

## 2017-09-20 DIAGNOSIS — M8580 Other specified disorders of bone density and structure, unspecified site: Secondary | ICD-10-CM | POA: Diagnosis not present

## 2017-12-07 DIAGNOSIS — M810 Age-related osteoporosis without current pathological fracture: Secondary | ICD-10-CM | POA: Diagnosis not present

## 2017-12-07 DIAGNOSIS — M8588 Other specified disorders of bone density and structure, other site: Secondary | ICD-10-CM | POA: Diagnosis not present

## 2017-12-21 DIAGNOSIS — M859 Disorder of bone density and structure, unspecified: Secondary | ICD-10-CM | POA: Diagnosis not present

## 2017-12-21 DIAGNOSIS — E039 Hypothyroidism, unspecified: Secondary | ICD-10-CM | POA: Diagnosis not present

## 2017-12-21 DIAGNOSIS — M8580 Other specified disorders of bone density and structure, unspecified site: Secondary | ICD-10-CM | POA: Diagnosis not present

## 2018-01-10 DIAGNOSIS — M810 Age-related osteoporosis without current pathological fracture: Secondary | ICD-10-CM | POA: Diagnosis not present

## 2018-03-27 DIAGNOSIS — E039 Hypothyroidism, unspecified: Secondary | ICD-10-CM | POA: Diagnosis not present

## 2018-03-27 DIAGNOSIS — E785 Hyperlipidemia, unspecified: Secondary | ICD-10-CM | POA: Diagnosis not present

## 2018-03-27 DIAGNOSIS — M810 Age-related osteoporosis without current pathological fracture: Secondary | ICD-10-CM | POA: Diagnosis not present

## 2018-03-27 DIAGNOSIS — E1169 Type 2 diabetes mellitus with other specified complication: Secondary | ICD-10-CM | POA: Diagnosis not present

## 2018-03-27 DIAGNOSIS — Z23 Encounter for immunization: Secondary | ICD-10-CM | POA: Diagnosis not present

## 2018-03-27 DIAGNOSIS — I10 Essential (primary) hypertension: Secondary | ICD-10-CM | POA: Diagnosis not present

## 2018-09-25 DIAGNOSIS — M2550 Pain in unspecified joint: Secondary | ICD-10-CM | POA: Diagnosis not present

## 2018-09-25 DIAGNOSIS — Z1211 Encounter for screening for malignant neoplasm of colon: Secondary | ICD-10-CM | POA: Diagnosis not present

## 2018-09-25 DIAGNOSIS — E785 Hyperlipidemia, unspecified: Secondary | ICD-10-CM | POA: Diagnosis not present

## 2018-09-25 DIAGNOSIS — Z681 Body mass index (BMI) 19 or less, adult: Secondary | ICD-10-CM | POA: Diagnosis not present

## 2018-09-25 DIAGNOSIS — E039 Hypothyroidism, unspecified: Secondary | ICD-10-CM | POA: Diagnosis not present

## 2018-09-25 DIAGNOSIS — F17210 Nicotine dependence, cigarettes, uncomplicated: Secondary | ICD-10-CM | POA: Diagnosis not present

## 2018-09-25 DIAGNOSIS — E1169 Type 2 diabetes mellitus with other specified complication: Secondary | ICD-10-CM | POA: Diagnosis not present

## 2018-09-25 DIAGNOSIS — I10 Essential (primary) hypertension: Secondary | ICD-10-CM | POA: Diagnosis not present

## 2018-12-29 DIAGNOSIS — E039 Hypothyroidism, unspecified: Secondary | ICD-10-CM | POA: Diagnosis not present

## 2019-04-06 DIAGNOSIS — E039 Hypothyroidism, unspecified: Secondary | ICD-10-CM | POA: Diagnosis not present

## 2019-04-06 DIAGNOSIS — M810 Age-related osteoporosis without current pathological fracture: Secondary | ICD-10-CM | POA: Diagnosis not present

## 2019-04-06 DIAGNOSIS — E1169 Type 2 diabetes mellitus with other specified complication: Secondary | ICD-10-CM | POA: Diagnosis not present

## 2019-04-06 DIAGNOSIS — F17210 Nicotine dependence, cigarettes, uncomplicated: Secondary | ICD-10-CM | POA: Diagnosis not present

## 2019-04-06 DIAGNOSIS — E785 Hyperlipidemia, unspecified: Secondary | ICD-10-CM | POA: Diagnosis not present

## 2019-04-06 DIAGNOSIS — Z0000 Encounter for general adult medical examination without abnormal findings: Secondary | ICD-10-CM | POA: Diagnosis not present

## 2019-04-06 DIAGNOSIS — I10 Essential (primary) hypertension: Secondary | ICD-10-CM | POA: Diagnosis not present

## 2019-04-24 DIAGNOSIS — Z23 Encounter for immunization: Secondary | ICD-10-CM | POA: Diagnosis not present

## 2019-08-17 ENCOUNTER — Ambulatory Visit: Payer: Managed Care, Other (non HMO)

## 2019-08-24 ENCOUNTER — Ambulatory Visit: Payer: Medicare Other | Attending: Internal Medicine

## 2019-08-24 DIAGNOSIS — Z23 Encounter for immunization: Secondary | ICD-10-CM | POA: Insufficient documentation

## 2019-08-24 NOTE — Progress Notes (Signed)
   Covid-19 Vaccination Clinic  Name:  Louiza Huwe    MRN: [id number] DOB: Feb 04, 1946  08/24/2019  Ms. Carlyle was observed post Covid-19 immunization for 15 minutes without incidence. She was provided with Vaccine Information Sheet and instruction to access the V-Safe system.   Ms. Draft was instructed to call 911 with any severe reactions post vaccine: Marland Kitchen Difficulty breathing  . Swelling of your face and throat  . A fast heartbeat  . A bad rash all over your body  . Dizziness and weakness    Immunizations Administered    Name Date Dose VIS Date Route   Pfizer COVID-19 Vaccine 08/24/2019 11:16 AM 0.3 mL 06/29/2019 Intramuscular   Manufacturer: [place]   Lot: [id number]   [place]: [id number]

## 2019-09-03 ENCOUNTER — Ambulatory Visit: Payer: Managed Care, Other (non HMO)

## 2019-09-18 ENCOUNTER — Ambulatory Visit: Payer: Medicare Other | Attending: Internal Medicine

## 2019-09-18 DIAGNOSIS — Z23 Encounter for immunization: Secondary | ICD-10-CM | POA: Insufficient documentation

## 2019-09-18 NOTE — Progress Notes (Signed)
   Covid-19 Vaccination Clinic  Name:  Stephanie Sharp    MRN: [id number] DOB: [birth year]  09/18/2019  [name] was observed post Covid-19 immunization for 15 minutes without incident. She was provided with Vaccine Information Sheet and instruction to access the V-Safe system.   [name] was instructed to call 911 with any severe reactions post vaccine: Marland Kitchen Difficulty breathing  . Swelling of face and throat  . A fast heartbeat  . A bad rash all over body  . Dizziness and weakness   Immunizations Administered    Name Date Dose VIS Date Route   Pfizer COVID-19 Vaccine 09/18/2019 12:00 PM 0.3 mL 06/29/2019 Intramuscular   Manufacturer: [place]   Lot: [id number]   [place]: [id number]

## 2019-10-02 DIAGNOSIS — F17210 Nicotine dependence, cigarettes, uncomplicated: Secondary | ICD-10-CM | POA: Diagnosis not present

## 2019-10-02 DIAGNOSIS — E039 Hypothyroidism, unspecified: Secondary | ICD-10-CM | POA: Diagnosis not present

## 2019-10-02 DIAGNOSIS — E785 Hyperlipidemia, unspecified: Secondary | ICD-10-CM | POA: Diagnosis not present

## 2019-10-02 DIAGNOSIS — I10 Essential (primary) hypertension: Secondary | ICD-10-CM | POA: Diagnosis not present

## 2019-10-02 DIAGNOSIS — E1169 Type 2 diabetes mellitus with other specified complication: Secondary | ICD-10-CM | POA: Diagnosis not present

## 2019-10-02 DIAGNOSIS — Z1211 Encounter for screening for malignant neoplasm of colon: Secondary | ICD-10-CM | POA: Diagnosis not present

## 2019-10-02 DIAGNOSIS — Z681 Body mass index (BMI) 19 or less, adult: Secondary | ICD-10-CM | POA: Diagnosis not present

## 2019-10-05 DIAGNOSIS — Z1211 Encounter for screening for malignant neoplasm of colon: Secondary | ICD-10-CM | POA: Diagnosis not present

## 2020-04-04 DIAGNOSIS — E785 Hyperlipidemia, unspecified: Secondary | ICD-10-CM | POA: Diagnosis not present

## 2020-04-04 DIAGNOSIS — I10 Essential (primary) hypertension: Secondary | ICD-10-CM | POA: Diagnosis not present

## 2020-04-04 DIAGNOSIS — F17210 Nicotine dependence, cigarettes, uncomplicated: Secondary | ICD-10-CM | POA: Diagnosis not present

## 2020-04-04 DIAGNOSIS — R29898 Other symptoms and signs involving the musculoskeletal system: Secondary | ICD-10-CM | POA: Diagnosis not present

## 2020-04-04 DIAGNOSIS — E039 Hypothyroidism, unspecified: Secondary | ICD-10-CM | POA: Diagnosis not present

## 2020-04-04 DIAGNOSIS — B029 Zoster without complications: Secondary | ICD-10-CM | POA: Diagnosis not present

## 2020-04-04 DIAGNOSIS — Z23 Encounter for immunization: Secondary | ICD-10-CM | POA: Diagnosis not present

## 2020-04-04 DIAGNOSIS — Z681 Body mass index (BMI) 19 or less, adult: Secondary | ICD-10-CM | POA: Diagnosis not present

## 2020-04-07 DIAGNOSIS — M5416 Radiculopathy, lumbar region: Secondary | ICD-10-CM | POA: Diagnosis not present

## 2020-04-09 DIAGNOSIS — M5416 Radiculopathy, lumbar region: Secondary | ICD-10-CM | POA: Diagnosis not present

## 2020-05-19 DIAGNOSIS — M5416 Radiculopathy, lumbar region: Secondary | ICD-10-CM | POA: Diagnosis not present

## 2020-05-19 DIAGNOSIS — M25552 Pain in left hip: Secondary | ICD-10-CM | POA: Diagnosis not present

## 2020-06-02 DIAGNOSIS — Z23 Encounter for immunization: Secondary | ICD-10-CM | POA: Diagnosis not present

## 2020-06-30 DIAGNOSIS — E039 Hypothyroidism, unspecified: Secondary | ICD-10-CM | POA: Diagnosis not present

## 2020-09-26 DIAGNOSIS — E039 Hypothyroidism, unspecified: Secondary | ICD-10-CM | POA: Diagnosis not present

## 2020-09-26 DIAGNOSIS — E785 Hyperlipidemia, unspecified: Secondary | ICD-10-CM | POA: Diagnosis not present

## 2020-09-26 DIAGNOSIS — R5383 Other fatigue: Secondary | ICD-10-CM | POA: Diagnosis not present

## 2020-09-26 DIAGNOSIS — F419 Anxiety disorder, unspecified: Secondary | ICD-10-CM | POA: Diagnosis not present

## 2020-10-06 DIAGNOSIS — I10 Essential (primary) hypertension: Secondary | ICD-10-CM | POA: Diagnosis not present

## 2020-10-06 DIAGNOSIS — Z0000 Encounter for general adult medical examination without abnormal findings: Secondary | ICD-10-CM | POA: Diagnosis not present

## 2020-10-06 DIAGNOSIS — E1169 Type 2 diabetes mellitus with other specified complication: Secondary | ICD-10-CM | POA: Diagnosis not present

## 2020-10-06 DIAGNOSIS — F17210 Nicotine dependence, cigarettes, uncomplicated: Secondary | ICD-10-CM | POA: Diagnosis not present

## 2020-10-06 DIAGNOSIS — Z6820 Body mass index (BMI) 20.0-20.9, adult: Secondary | ICD-10-CM | POA: Diagnosis not present

## 2020-10-06 DIAGNOSIS — E785 Hyperlipidemia, unspecified: Secondary | ICD-10-CM | POA: Diagnosis not present

## 2020-10-06 DIAGNOSIS — E039 Hypothyroidism, unspecified: Secondary | ICD-10-CM | POA: Diagnosis not present

## 2020-10-06 DIAGNOSIS — Z1389 Encounter for screening for other disorder: Secondary | ICD-10-CM | POA: Diagnosis not present

## 2020-10-06 DIAGNOSIS — Z1211 Encounter for screening for malignant neoplasm of colon: Secondary | ICD-10-CM | POA: Diagnosis not present

## 2020-10-10 DIAGNOSIS — Z1211 Encounter for screening for malignant neoplasm of colon: Secondary | ICD-10-CM | POA: Diagnosis not present

## 2020-12-29 DIAGNOSIS — Z23 Encounter for immunization: Secondary | ICD-10-CM | POA: Diagnosis not present

## 2021-01-07 DIAGNOSIS — Z0000 Encounter for general adult medical examination without abnormal findings: Secondary | ICD-10-CM | POA: Diagnosis not present

## 2021-01-07 DIAGNOSIS — Z6820 Body mass index (BMI) 20.0-20.9, adult: Secondary | ICD-10-CM | POA: Diagnosis not present

## 2021-01-07 DIAGNOSIS — E039 Hypothyroidism, unspecified: Secondary | ICD-10-CM | POA: Diagnosis not present

## 2021-01-07 DIAGNOSIS — F17210 Nicotine dependence, cigarettes, uncomplicated: Secondary | ICD-10-CM | POA: Diagnosis not present

## 2021-01-07 DIAGNOSIS — I10 Essential (primary) hypertension: Secondary | ICD-10-CM | POA: Diagnosis not present

## 2021-01-07 DIAGNOSIS — Z1211 Encounter for screening for malignant neoplasm of colon: Secondary | ICD-10-CM | POA: Diagnosis not present

## 2021-01-07 DIAGNOSIS — Z1389 Encounter for screening for other disorder: Secondary | ICD-10-CM | POA: Diagnosis not present

## 2021-01-07 DIAGNOSIS — E785 Hyperlipidemia, unspecified: Secondary | ICD-10-CM | POA: Diagnosis not present

## 2021-01-07 DIAGNOSIS — E1169 Type 2 diabetes mellitus with other specified complication: Secondary | ICD-10-CM | POA: Diagnosis not present

## 2021-04-10 DIAGNOSIS — E1169 Type 2 diabetes mellitus with other specified complication: Secondary | ICD-10-CM | POA: Diagnosis not present

## 2021-04-10 DIAGNOSIS — E7800 Pure hypercholesterolemia, unspecified: Secondary | ICD-10-CM | POA: Diagnosis not present

## 2021-04-10 DIAGNOSIS — I10 Essential (primary) hypertension: Secondary | ICD-10-CM | POA: Diagnosis not present

## 2021-04-10 DIAGNOSIS — F17210 Nicotine dependence, cigarettes, uncomplicated: Secondary | ICD-10-CM | POA: Diagnosis not present

## 2021-04-10 DIAGNOSIS — E039 Hypothyroidism, unspecified: Secondary | ICD-10-CM | POA: Diagnosis not present

## 2021-04-30 DIAGNOSIS — Z23 Encounter for immunization: Secondary | ICD-10-CM | POA: Diagnosis not present

## 2021-05-04 DIAGNOSIS — H524 Presbyopia: Secondary | ICD-10-CM | POA: Diagnosis not present

## 2021-05-04 DIAGNOSIS — Q141 Congenital malformation of retina: Secondary | ICD-10-CM | POA: Diagnosis not present

## 2021-05-04 DIAGNOSIS — H2513 Age-related nuclear cataract, bilateral: Secondary | ICD-10-CM | POA: Diagnosis not present

## 2021-11-04 DIAGNOSIS — E1169 Type 2 diabetes mellitus with other specified complication: Secondary | ICD-10-CM | POA: Diagnosis not present

## 2021-11-04 DIAGNOSIS — E039 Hypothyroidism, unspecified: Secondary | ICD-10-CM | POA: Diagnosis not present

## 2021-11-04 DIAGNOSIS — E7800 Pure hypercholesterolemia, unspecified: Secondary | ICD-10-CM | POA: Diagnosis not present

## 2021-11-04 DIAGNOSIS — I10 Essential (primary) hypertension: Secondary | ICD-10-CM | POA: Diagnosis not present

## 2021-11-09 DIAGNOSIS — Z6820 Body mass index (BMI) 20.0-20.9, adult: Secondary | ICD-10-CM | POA: Diagnosis not present

## 2021-11-09 DIAGNOSIS — F17210 Nicotine dependence, cigarettes, uncomplicated: Secondary | ICD-10-CM | POA: Diagnosis not present

## 2021-11-09 DIAGNOSIS — Z0000 Encounter for general adult medical examination without abnormal findings: Secondary | ICD-10-CM | POA: Diagnosis not present

## 2021-11-09 DIAGNOSIS — E1169 Type 2 diabetes mellitus with other specified complication: Secondary | ICD-10-CM | POA: Diagnosis not present

## 2021-11-09 DIAGNOSIS — M79605 Pain in left leg: Secondary | ICD-10-CM | POA: Diagnosis not present

## 2021-11-09 DIAGNOSIS — Z1389 Encounter for screening for other disorder: Secondary | ICD-10-CM | POA: Diagnosis not present

## 2021-11-09 DIAGNOSIS — I10 Essential (primary) hypertension: Secondary | ICD-10-CM | POA: Diagnosis not present

## 2021-11-09 DIAGNOSIS — Z1211 Encounter for screening for malignant neoplasm of colon: Secondary | ICD-10-CM | POA: Diagnosis not present

## 2021-11-09 DIAGNOSIS — E039 Hypothyroidism, unspecified: Secondary | ICD-10-CM | POA: Diagnosis not present

## 2021-11-09 DIAGNOSIS — E7800 Pure hypercholesterolemia, unspecified: Secondary | ICD-10-CM | POA: Diagnosis not present

## 2021-11-10 ENCOUNTER — Other Ambulatory Visit: Payer: Self-pay | Admitting: Family Medicine

## 2021-11-10 DIAGNOSIS — M79605 Pain in left leg: Secondary | ICD-10-CM

## 2021-11-21 ENCOUNTER — Ambulatory Visit
Admission: RE | Admit: 2021-11-21 | Discharge: 2021-11-21 | Disposition: A | Discharge status: Home / Self Care | Payer: Medicare Other | Source: Ambulatory Visit | Attending: Family Medicine | Admitting: Family Medicine

## 2021-11-21 DIAGNOSIS — M79605 Pain in left leg: Secondary | ICD-10-CM | POA: Diagnosis not present

## 2021-11-21 DIAGNOSIS — M545 Low back pain, unspecified: Secondary | ICD-10-CM | POA: Diagnosis not present

## 2021-11-21 DIAGNOSIS — M48061 Spinal stenosis, lumbar region without neurogenic claudication: Secondary | ICD-10-CM | POA: Diagnosis not present

## 2021-11-27 DIAGNOSIS — M6281 Muscle weakness (generalized): Secondary | ICD-10-CM | POA: Diagnosis not present

## 2021-11-27 DIAGNOSIS — M545 Low back pain, unspecified: Secondary | ICD-10-CM | POA: Diagnosis not present

## 2021-11-27 DIAGNOSIS — M25552 Pain in left hip: Secondary | ICD-10-CM | POA: Diagnosis not present

## 2021-11-27 DIAGNOSIS — R293 Abnormal posture: Secondary | ICD-10-CM | POA: Diagnosis not present

## 2021-11-27 DIAGNOSIS — M25652 Stiffness of left hip, not elsewhere classified: Secondary | ICD-10-CM | POA: Diagnosis not present

## 2021-12-03 DIAGNOSIS — M6281 Muscle weakness (generalized): Secondary | ICD-10-CM | POA: Diagnosis not present

## 2021-12-03 DIAGNOSIS — M545 Low back pain, unspecified: Secondary | ICD-10-CM | POA: Diagnosis not present

## 2021-12-03 DIAGNOSIS — M25652 Stiffness of left hip, not elsewhere classified: Secondary | ICD-10-CM | POA: Diagnosis not present

## 2021-12-03 DIAGNOSIS — R293 Abnormal posture: Secondary | ICD-10-CM | POA: Diagnosis not present

## 2021-12-03 DIAGNOSIS — M25552 Pain in left hip: Secondary | ICD-10-CM | POA: Diagnosis not present

## 2021-12-08 DIAGNOSIS — M545 Low back pain, unspecified: Secondary | ICD-10-CM | POA: Diagnosis not present

## 2021-12-08 DIAGNOSIS — M25652 Stiffness of left hip, not elsewhere classified: Secondary | ICD-10-CM | POA: Diagnosis not present

## 2021-12-08 DIAGNOSIS — M25552 Pain in left hip: Secondary | ICD-10-CM | POA: Diagnosis not present

## 2021-12-08 DIAGNOSIS — R293 Abnormal posture: Secondary | ICD-10-CM | POA: Diagnosis not present

## 2021-12-08 DIAGNOSIS — M6281 Muscle weakness (generalized): Secondary | ICD-10-CM | POA: Diagnosis not present

## 2021-12-11 DIAGNOSIS — R293 Abnormal posture: Secondary | ICD-10-CM | POA: Diagnosis not present

## 2021-12-11 DIAGNOSIS — M25552 Pain in left hip: Secondary | ICD-10-CM | POA: Diagnosis not present

## 2021-12-11 DIAGNOSIS — M545 Low back pain, unspecified: Secondary | ICD-10-CM | POA: Diagnosis not present

## 2021-12-11 DIAGNOSIS — M6281 Muscle weakness (generalized): Secondary | ICD-10-CM | POA: Diagnosis not present

## 2021-12-11 DIAGNOSIS — M25652 Stiffness of left hip, not elsewhere classified: Secondary | ICD-10-CM | POA: Diagnosis not present

## 2021-12-15 DIAGNOSIS — M6281 Muscle weakness (generalized): Secondary | ICD-10-CM | POA: Diagnosis not present

## 2021-12-15 DIAGNOSIS — M25652 Stiffness of left hip, not elsewhere classified: Secondary | ICD-10-CM | POA: Diagnosis not present

## 2021-12-15 DIAGNOSIS — R293 Abnormal posture: Secondary | ICD-10-CM | POA: Diagnosis not present

## 2021-12-15 DIAGNOSIS — M25552 Pain in left hip: Secondary | ICD-10-CM | POA: Diagnosis not present

## 2021-12-15 DIAGNOSIS — M545 Low back pain, unspecified: Secondary | ICD-10-CM | POA: Diagnosis not present

## 2021-12-18 DIAGNOSIS — M25552 Pain in left hip: Secondary | ICD-10-CM | POA: Diagnosis not present

## 2021-12-18 DIAGNOSIS — M545 Low back pain, unspecified: Secondary | ICD-10-CM | POA: Diagnosis not present

## 2021-12-18 DIAGNOSIS — R293 Abnormal posture: Secondary | ICD-10-CM | POA: Diagnosis not present

## 2021-12-18 DIAGNOSIS — M6281 Muscle weakness (generalized): Secondary | ICD-10-CM | POA: Diagnosis not present

## 2021-12-18 DIAGNOSIS — M25652 Stiffness of left hip, not elsewhere classified: Secondary | ICD-10-CM | POA: Diagnosis not present

## 2021-12-21 DIAGNOSIS — M6281 Muscle weakness (generalized): Secondary | ICD-10-CM | POA: Diagnosis not present

## 2021-12-21 DIAGNOSIS — M25552 Pain in left hip: Secondary | ICD-10-CM | POA: Diagnosis not present

## 2021-12-21 DIAGNOSIS — M25652 Stiffness of left hip, not elsewhere classified: Secondary | ICD-10-CM | POA: Diagnosis not present

## 2021-12-21 DIAGNOSIS — M545 Low back pain, unspecified: Secondary | ICD-10-CM | POA: Diagnosis not present

## 2021-12-21 DIAGNOSIS — R293 Abnormal posture: Secondary | ICD-10-CM | POA: Diagnosis not present

## 2021-12-24 DIAGNOSIS — R293 Abnormal posture: Secondary | ICD-10-CM | POA: Diagnosis not present

## 2021-12-24 DIAGNOSIS — M6281 Muscle weakness (generalized): Secondary | ICD-10-CM | POA: Diagnosis not present

## 2021-12-24 DIAGNOSIS — M25652 Stiffness of left hip, not elsewhere classified: Secondary | ICD-10-CM | POA: Diagnosis not present

## 2021-12-24 DIAGNOSIS — M545 Low back pain, unspecified: Secondary | ICD-10-CM | POA: Diagnosis not present

## 2021-12-24 DIAGNOSIS — M25552 Pain in left hip: Secondary | ICD-10-CM | POA: Diagnosis not present

## 2021-12-29 ENCOUNTER — Other Ambulatory Visit: Payer: Self-pay | Admitting: *Deleted

## 2021-12-29 DIAGNOSIS — R293 Abnormal posture: Secondary | ICD-10-CM | POA: Diagnosis not present

## 2021-12-29 DIAGNOSIS — M545 Low back pain, unspecified: Secondary | ICD-10-CM | POA: Diagnosis not present

## 2021-12-29 DIAGNOSIS — F17210 Nicotine dependence, cigarettes, uncomplicated: Secondary | ICD-10-CM

## 2021-12-29 DIAGNOSIS — M25552 Pain in left hip: Secondary | ICD-10-CM | POA: Diagnosis not present

## 2021-12-29 DIAGNOSIS — M25652 Stiffness of left hip, not elsewhere classified: Secondary | ICD-10-CM | POA: Diagnosis not present

## 2021-12-29 DIAGNOSIS — Z87891 Personal history of nicotine dependence: Secondary | ICD-10-CM

## 2021-12-29 DIAGNOSIS — M6281 Muscle weakness (generalized): Secondary | ICD-10-CM | POA: Diagnosis not present

## 2021-12-29 DIAGNOSIS — Z122 Encounter for screening for malignant neoplasm of respiratory organs: Secondary | ICD-10-CM

## 2021-12-31 DIAGNOSIS — M545 Low back pain, unspecified: Secondary | ICD-10-CM | POA: Diagnosis not present

## 2021-12-31 DIAGNOSIS — M6281 Muscle weakness (generalized): Secondary | ICD-10-CM | POA: Diagnosis not present

## 2021-12-31 DIAGNOSIS — M25552 Pain in left hip: Secondary | ICD-10-CM | POA: Diagnosis not present

## 2021-12-31 DIAGNOSIS — R293 Abnormal posture: Secondary | ICD-10-CM | POA: Diagnosis not present

## 2021-12-31 DIAGNOSIS — M25652 Stiffness of left hip, not elsewhere classified: Secondary | ICD-10-CM | POA: Diagnosis not present

## 2022-01-04 DIAGNOSIS — R293 Abnormal posture: Secondary | ICD-10-CM | POA: Diagnosis not present

## 2022-01-04 DIAGNOSIS — M25652 Stiffness of left hip, not elsewhere classified: Secondary | ICD-10-CM | POA: Diagnosis not present

## 2022-01-04 DIAGNOSIS — M545 Low back pain, unspecified: Secondary | ICD-10-CM | POA: Diagnosis not present

## 2022-01-04 DIAGNOSIS — M25552 Pain in left hip: Secondary | ICD-10-CM | POA: Diagnosis not present

## 2022-01-04 DIAGNOSIS — M6281 Muscle weakness (generalized): Secondary | ICD-10-CM | POA: Diagnosis not present

## 2022-01-07 DIAGNOSIS — M25652 Stiffness of left hip, not elsewhere classified: Secondary | ICD-10-CM | POA: Diagnosis not present

## 2022-01-07 DIAGNOSIS — M6281 Muscle weakness (generalized): Secondary | ICD-10-CM | POA: Diagnosis not present

## 2022-01-07 DIAGNOSIS — M545 Low back pain, unspecified: Secondary | ICD-10-CM | POA: Diagnosis not present

## 2022-01-07 DIAGNOSIS — R293 Abnormal posture: Secondary | ICD-10-CM | POA: Diagnosis not present

## 2022-01-07 DIAGNOSIS — M25552 Pain in left hip: Secondary | ICD-10-CM | POA: Diagnosis not present

## 2022-01-13 DIAGNOSIS — R293 Abnormal posture: Secondary | ICD-10-CM | POA: Diagnosis not present

## 2022-01-13 DIAGNOSIS — M545 Low back pain, unspecified: Secondary | ICD-10-CM | POA: Diagnosis not present

## 2022-01-13 DIAGNOSIS — M25552 Pain in left hip: Secondary | ICD-10-CM | POA: Diagnosis not present

## 2022-01-13 DIAGNOSIS — M25652 Stiffness of left hip, not elsewhere classified: Secondary | ICD-10-CM | POA: Diagnosis not present

## 2022-01-13 DIAGNOSIS — M6281 Muscle weakness (generalized): Secondary | ICD-10-CM | POA: Diagnosis not present

## 2022-01-15 DIAGNOSIS — M6281 Muscle weakness (generalized): Secondary | ICD-10-CM | POA: Diagnosis not present

## 2022-01-15 DIAGNOSIS — M25552 Pain in left hip: Secondary | ICD-10-CM | POA: Diagnosis not present

## 2022-01-15 DIAGNOSIS — R293 Abnormal posture: Secondary | ICD-10-CM | POA: Diagnosis not present

## 2022-01-15 DIAGNOSIS — M545 Low back pain, unspecified: Secondary | ICD-10-CM | POA: Diagnosis not present

## 2022-01-15 DIAGNOSIS — M25652 Stiffness of left hip, not elsewhere classified: Secondary | ICD-10-CM | POA: Diagnosis not present

## 2022-01-20 DIAGNOSIS — M6281 Muscle weakness (generalized): Secondary | ICD-10-CM | POA: Diagnosis not present

## 2022-01-20 DIAGNOSIS — M25552 Pain in left hip: Secondary | ICD-10-CM | POA: Diagnosis not present

## 2022-01-20 DIAGNOSIS — R293 Abnormal posture: Secondary | ICD-10-CM | POA: Diagnosis not present

## 2022-01-20 DIAGNOSIS — M545 Low back pain, unspecified: Secondary | ICD-10-CM | POA: Diagnosis not present

## 2022-01-20 DIAGNOSIS — M25652 Stiffness of left hip, not elsewhere classified: Secondary | ICD-10-CM | POA: Diagnosis not present

## 2022-01-22 DIAGNOSIS — M25652 Stiffness of left hip, not elsewhere classified: Secondary | ICD-10-CM | POA: Diagnosis not present

## 2022-01-22 DIAGNOSIS — R293 Abnormal posture: Secondary | ICD-10-CM | POA: Diagnosis not present

## 2022-01-22 DIAGNOSIS — M545 Low back pain, unspecified: Secondary | ICD-10-CM | POA: Diagnosis not present

## 2022-01-22 DIAGNOSIS — M25552 Pain in left hip: Secondary | ICD-10-CM | POA: Diagnosis not present

## 2022-01-22 DIAGNOSIS — M6281 Muscle weakness (generalized): Secondary | ICD-10-CM | POA: Diagnosis not present

## 2022-01-26 ENCOUNTER — Ambulatory Visit (INDEPENDENT_AMBULATORY_CARE_PROVIDER_SITE_OTHER): Payer: Medicare Other | Admitting: Acute Care

## 2022-01-26 ENCOUNTER — Encounter: Payer: Self-pay | Admitting: Acute Care

## 2022-01-26 DIAGNOSIS — F17210 Nicotine dependence, cigarettes, uncomplicated: Secondary | ICD-10-CM

## 2022-01-26 NOTE — Progress Notes (Signed)
Virtual Visit via Telephone Note  I connected with Stephanie Sharp on 06/02/21 at  2:00 PM EST by telephone and verified that I am speaking with the correct person using two identifiers.  Location: Patient: Home Provider: Working from home   I discussed the limitations, risks, security and privacy concerns of performing an evaluation and management service by telephone and the availability of in person appointments. I also discussed with the patient that there may be a patient responsible charge related to this service. The patient expressed understanding and agreed to proceed.  Shared Decision Making Visit Lung Cancer Screening Program [phone])   Eligibility: Age 76 y.o. Pack Years Smoking History Calculation 42 (# packs/per year x # years smoked) Recent History of coughing up blood  no Unexplained weight loss? no ( >Than 15 pounds within the last 6 months ) Prior History Lung / other cancer no (Diagnosis within the last 5 years already requiring surveillance chest CT Scans). Smoking Status Current Smoker Former Smokers: Years since quit: NA  Quit Date: NA  Visit Components: Discussion included one or more decision making aids. yes Discussion included risk/benefits of screening. yes Discussion included potential follow up diagnostic testing for abnormal scans. yes Discussion included meaning and risk of over diagnosis. yes Discussion included meaning and risk of False Positives. yes Discussion included meaning of total radiation exposure. yes  Counseling Included: Importance of adherence to annual lung cancer LDCT screening. yes Impact of comorbidities on ability to participate in the program. yes Ability and willingness to under diagnostic treatment. yes  Smoking Cessation Counseling: Current Smokers:  Discussed importance of smoking cessation. yes Information about tobacco cessation classes and interventions provided to patient. yes Patient provided with "ticket" for  LDCT Scan. yes Symptomatic Patient. yes  Counseling(Intermediate counseling: > three minutes) [id number] Diagnosis Code: Tobacco Use Z72.0 Asymptomatic Patient no  Counseling NA Former Smokers:  Discussed the importance of maintaining cigarette abstinence. yes Diagnosis Code: Personal History of Nicotine Dependence. V67.209 Information about tobacco cessation classes and interventions provided to patient. Yes Patient provided with "ticket" for LDCT Scan. yes Written Order for Lung Cancer Screening with LDCT placed in Epic. Yes (CT Chest Lung Cancer Screening Low Dose W/O CM) [id number] Z12.2-Screening of respiratory organs Z87.891-Personal history of nicotine dependence   I spent 25 minutes of face to face time with her discussing the risks and benefits of lung cancer screening. We viewed a power point together that explained in detail the above noted topics. We took the time to pause the power point at intervals to allow for questions to be asked and answered to ensure understanding. We discussed that she had taken the single most powerful action possible to decrease her risk of developing lung cancer when she quit smoking. I counseled her to remain smoke free, and to contact me if she ever had the desire to smoke again so that I can provide resources and tools to help support the effort to remain smoke free. We discussed the time and location of the scan, and that either  Doroteo Glassman RN or I will call with the results within  24-48 hours of receiving them. She has my card and contact information in the event she needs to speak with me, in addition to a copy of the power point we reviewed as a resource. She verbalized understanding of all of the above and had no further questions upon leaving the office.     I explained to the patient that there has been a high  incidence of coronary artery disease noted on these exams. I explained that this is a non-gated exam therefore degree or severity cannot be  determined. This patient is not on statin therapy. I have asked the patient to follow-up with their PCP regarding any incidental finding of coronary artery disease and management with diet or medication as they feel is clinically indicated. The patient verbalized understanding of the above and had no further questions.   I spent 3 minutes counseling on smoking cessation and the health risks of continued tobacco abuse    Dekisha Mesmer D. Kenton Kingfisher, NP-C [facility] Personal contact information can be found on Amion  01/26/2022, 11:05 AM

## 2022-01-26 NOTE — Patient Instructions (Signed)
Thank you for participating in the [facility]. It was our pleasure to meet you today. We will call you with the results of your scan within the next few days. Your scan will be assigned a Lung RADS category score by the physicians reading the scans.  This Lung RADS score determines follow up scanning.  See below for description of categories, and follow up screening recommendations. We will be in touch to schedule your follow up screening annually or based on recommendations of our providers. We will fax a copy of your scan results to your Primary Care Physician, or the physician who referred you to the program, to ensure they have the results. Please call the office if you have any questions or concerns regarding your scanning experience or results.  Our office number is [phone]. Please speak with Denise Phelps, RN. , or  Denise Buckner RN, They are  our Lung Cancer Screening RN.'s If They are unavailable when you call, Please leave a message on the voice mail. We will return your call at our earliest convenience.This voice mail is monitored several times a day.  Remember, if your scan is normal, we will scan you annually as long as you continue to meet the criteria for the program. (Age 55-77, Current smoker or smoker who has quit within the last 15 years). If you are a smoker, remember, quitting is the single most powerful action that you can take to decrease your risk of lung cancer and other pulmonary, breathing related problems. We know quitting is hard, and we are here to help.  Please let us know if there is anything we can do to help you meet your goal of quitting. If you are a former smoker, congratulations. We are proud of you! Remain smoke free! Remember you can refer friends or family members through the number above.  We will screen them to make sure they meet criteria for the program. Thank you for helping us take better care of you by  participating in Lung Screening.  You can receive free nicotine replacement therapy ( patches, gum or mints) by calling 1-800-QUIT NOW. Please call so we can get you on the path to becoming  a non-smoker. I know it is hard, but you can do this!  Lung RADS Categories:  Lung RADS 1: no nodules or definitely non-concerning nodules.  Recommendation is for a repeat annual scan in 12 months.  Lung RADS 2:  nodules that are non-concerning in appearance and behavior with a very low likelihood of becoming an active cancer. Recommendation is for a repeat annual scan in 12 months.  Lung RADS 3: nodules that are probably non-concerning , includes nodules with a low likelihood of becoming an active cancer.  Recommendation is for a 6-month repeat screening scan. Often noted after an upper respiratory illness. We will be in touch to make sure you have no questions, and to schedule your 6-month scan.  Lung RADS 4 A: nodules with concerning findings, recommendation is most often for a follow up scan in 3 months or additional testing based on our provider's assessment of the scan. We will be in touch to make sure you have no questions and to schedule the recommended 3 month follow up scan.  Lung RADS 4 B:  indicates findings that are concerning. We will be in touch with you to schedule additional diagnostic testing based on our provider's  assessment of the scan.  Other options for assistance in smoking cessation (   As covered by your insurance benefits)  Hypnosis for smoking cessation  [employer] [phone]  Acupuncture for smoking cessation  [facility] [phone]   

## 2022-01-27 ENCOUNTER — Ambulatory Visit
Admission: RE | Admit: 2022-01-27 | Discharge: 2022-01-27 | Disposition: A | Discharge status: Home / Self Care | Payer: Medicare Other | Source: Ambulatory Visit | Attending: Acute Care | Admitting: Acute Care

## 2022-01-27 DIAGNOSIS — M545 Low back pain, unspecified: Secondary | ICD-10-CM | POA: Diagnosis not present

## 2022-01-27 DIAGNOSIS — M25652 Stiffness of left hip, not elsewhere classified: Secondary | ICD-10-CM | POA: Diagnosis not present

## 2022-01-27 DIAGNOSIS — F17210 Nicotine dependence, cigarettes, uncomplicated: Secondary | ICD-10-CM

## 2022-01-27 DIAGNOSIS — R293 Abnormal posture: Secondary | ICD-10-CM | POA: Diagnosis not present

## 2022-01-27 DIAGNOSIS — Z87891 Personal history of nicotine dependence: Secondary | ICD-10-CM

## 2022-01-27 DIAGNOSIS — M25552 Pain in left hip: Secondary | ICD-10-CM | POA: Diagnosis not present

## 2022-01-27 DIAGNOSIS — M6281 Muscle weakness (generalized): Secondary | ICD-10-CM | POA: Diagnosis not present

## 2022-01-27 DIAGNOSIS — Z122 Encounter for screening for malignant neoplasm of respiratory organs: Secondary | ICD-10-CM

## 2022-01-29 ENCOUNTER — Other Ambulatory Visit: Payer: Self-pay | Admitting: Acute Care

## 2022-01-29 DIAGNOSIS — F17210 Nicotine dependence, cigarettes, uncomplicated: Secondary | ICD-10-CM

## 2022-01-29 DIAGNOSIS — M6281 Muscle weakness (generalized): Secondary | ICD-10-CM | POA: Diagnosis not present

## 2022-01-29 DIAGNOSIS — Z122 Encounter for screening for malignant neoplasm of respiratory organs: Secondary | ICD-10-CM

## 2022-01-29 DIAGNOSIS — M545 Low back pain, unspecified: Secondary | ICD-10-CM | POA: Diagnosis not present

## 2022-01-29 DIAGNOSIS — M25552 Pain in left hip: Secondary | ICD-10-CM | POA: Diagnosis not present

## 2022-01-29 DIAGNOSIS — Z87891 Personal history of nicotine dependence: Secondary | ICD-10-CM

## 2022-01-29 DIAGNOSIS — M25652 Stiffness of left hip, not elsewhere classified: Secondary | ICD-10-CM | POA: Diagnosis not present

## 2022-01-29 DIAGNOSIS — R293 Abnormal posture: Secondary | ICD-10-CM | POA: Diagnosis not present

## 2022-02-03 DIAGNOSIS — M25552 Pain in left hip: Secondary | ICD-10-CM | POA: Diagnosis not present

## 2022-02-03 DIAGNOSIS — M25652 Stiffness of left hip, not elsewhere classified: Secondary | ICD-10-CM | POA: Diagnosis not present

## 2022-02-03 DIAGNOSIS — R293 Abnormal posture: Secondary | ICD-10-CM | POA: Diagnosis not present

## 2022-02-03 DIAGNOSIS — M545 Low back pain, unspecified: Secondary | ICD-10-CM | POA: Diagnosis not present

## 2022-02-03 DIAGNOSIS — M6281 Muscle weakness (generalized): Secondary | ICD-10-CM | POA: Diagnosis not present

## 2022-02-05 DIAGNOSIS — M25652 Stiffness of left hip, not elsewhere classified: Secondary | ICD-10-CM | POA: Diagnosis not present

## 2022-02-05 DIAGNOSIS — M545 Low back pain, unspecified: Secondary | ICD-10-CM | POA: Diagnosis not present

## 2022-02-05 DIAGNOSIS — R293 Abnormal posture: Secondary | ICD-10-CM | POA: Diagnosis not present

## 2022-02-05 DIAGNOSIS — M6281 Muscle weakness (generalized): Secondary | ICD-10-CM | POA: Diagnosis not present

## 2022-02-05 DIAGNOSIS — M25552 Pain in left hip: Secondary | ICD-10-CM | POA: Diagnosis not present

## 2022-02-09 DIAGNOSIS — M25552 Pain in left hip: Secondary | ICD-10-CM | POA: Diagnosis not present

## 2022-02-09 DIAGNOSIS — M25652 Stiffness of left hip, not elsewhere classified: Secondary | ICD-10-CM | POA: Diagnosis not present

## 2022-02-09 DIAGNOSIS — M6281 Muscle weakness (generalized): Secondary | ICD-10-CM | POA: Diagnosis not present

## 2022-02-09 DIAGNOSIS — M545 Low back pain, unspecified: Secondary | ICD-10-CM | POA: Diagnosis not present

## 2022-02-09 DIAGNOSIS — R293 Abnormal posture: Secondary | ICD-10-CM | POA: Diagnosis not present

## 2022-02-11 DIAGNOSIS — M545 Low back pain, unspecified: Secondary | ICD-10-CM | POA: Diagnosis not present

## 2022-02-11 DIAGNOSIS — R293 Abnormal posture: Secondary | ICD-10-CM | POA: Diagnosis not present

## 2022-02-11 DIAGNOSIS — M25552 Pain in left hip: Secondary | ICD-10-CM | POA: Diagnosis not present

## 2022-02-11 DIAGNOSIS — M6281 Muscle weakness (generalized): Secondary | ICD-10-CM | POA: Diagnosis not present

## 2022-02-11 DIAGNOSIS — M25652 Stiffness of left hip, not elsewhere classified: Secondary | ICD-10-CM | POA: Diagnosis not present

## 2022-02-16 DIAGNOSIS — M25652 Stiffness of left hip, not elsewhere classified: Secondary | ICD-10-CM | POA: Diagnosis not present

## 2022-02-16 DIAGNOSIS — M545 Low back pain, unspecified: Secondary | ICD-10-CM | POA: Diagnosis not present

## 2022-02-16 DIAGNOSIS — R293 Abnormal posture: Secondary | ICD-10-CM | POA: Diagnosis not present

## 2022-02-16 DIAGNOSIS — M6281 Muscle weakness (generalized): Secondary | ICD-10-CM | POA: Diagnosis not present

## 2022-02-16 DIAGNOSIS — M25552 Pain in left hip: Secondary | ICD-10-CM | POA: Diagnosis not present

## 2022-02-19 DIAGNOSIS — M545 Low back pain, unspecified: Secondary | ICD-10-CM | POA: Diagnosis not present

## 2022-02-19 DIAGNOSIS — M25552 Pain in left hip: Secondary | ICD-10-CM | POA: Diagnosis not present

## 2022-02-19 DIAGNOSIS — M25652 Stiffness of left hip, not elsewhere classified: Secondary | ICD-10-CM | POA: Diagnosis not present

## 2022-02-19 DIAGNOSIS — R293 Abnormal posture: Secondary | ICD-10-CM | POA: Diagnosis not present

## 2022-02-19 DIAGNOSIS — M6281 Muscle weakness (generalized): Secondary | ICD-10-CM | POA: Diagnosis not present

## 2022-04-06 DIAGNOSIS — Z23 Encounter for immunization: Secondary | ICD-10-CM | POA: Diagnosis not present

## 2022-04-19 DIAGNOSIS — Q141 Congenital malformation of retina: Secondary | ICD-10-CM | POA: Diagnosis not present

## 2022-04-19 DIAGNOSIS — H2513 Age-related nuclear cataract, bilateral: Secondary | ICD-10-CM | POA: Diagnosis not present

## 2022-05-10 DIAGNOSIS — E7800 Pure hypercholesterolemia, unspecified: Secondary | ICD-10-CM | POA: Diagnosis not present

## 2022-05-10 DIAGNOSIS — Z6820 Body mass index (BMI) 20.0-20.9, adult: Secondary | ICD-10-CM | POA: Diagnosis not present

## 2022-05-10 DIAGNOSIS — E1169 Type 2 diabetes mellitus with other specified complication: Secondary | ICD-10-CM | POA: Diagnosis not present

## 2022-05-10 DIAGNOSIS — E039 Hypothyroidism, unspecified: Secondary | ICD-10-CM | POA: Diagnosis not present

## 2022-05-10 DIAGNOSIS — Z23 Encounter for immunization: Secondary | ICD-10-CM | POA: Diagnosis not present

## 2022-05-10 DIAGNOSIS — F17210 Nicotine dependence, cigarettes, uncomplicated: Secondary | ICD-10-CM | POA: Diagnosis not present

## 2022-05-10 DIAGNOSIS — I10 Essential (primary) hypertension: Secondary | ICD-10-CM | POA: Diagnosis not present

## 2022-11-15 DIAGNOSIS — Z6820 Body mass index (BMI) 20.0-20.9, adult: Secondary | ICD-10-CM | POA: Diagnosis not present

## 2022-11-15 DIAGNOSIS — E1169 Type 2 diabetes mellitus with other specified complication: Secondary | ICD-10-CM | POA: Diagnosis not present

## 2022-11-15 DIAGNOSIS — E039 Hypothyroidism, unspecified: Secondary | ICD-10-CM | POA: Diagnosis not present

## 2022-11-15 DIAGNOSIS — F17210 Nicotine dependence, cigarettes, uncomplicated: Secondary | ICD-10-CM | POA: Diagnosis not present

## 2022-11-15 DIAGNOSIS — E7800 Pure hypercholesterolemia, unspecified: Secondary | ICD-10-CM | POA: Diagnosis not present

## 2022-11-15 DIAGNOSIS — R4189 Other symptoms and signs involving cognitive functions and awareness: Secondary | ICD-10-CM | POA: Diagnosis not present

## 2022-11-15 DIAGNOSIS — I10 Essential (primary) hypertension: Secondary | ICD-10-CM | POA: Diagnosis not present

## 2022-12-06 DIAGNOSIS — Z0000 Encounter for general adult medical examination without abnormal findings: Secondary | ICD-10-CM | POA: Diagnosis not present

## 2022-12-06 DIAGNOSIS — Z6820 Body mass index (BMI) 20.0-20.9, adult: Secondary | ICD-10-CM | POA: Diagnosis not present

## 2022-12-06 DIAGNOSIS — Z1389 Encounter for screening for other disorder: Secondary | ICD-10-CM | POA: Diagnosis not present

## 2022-12-09 ENCOUNTER — Other Ambulatory Visit: Payer: Self-pay | Admitting: Family Medicine

## 2022-12-09 DIAGNOSIS — E2839 Other primary ovarian failure: Secondary | ICD-10-CM

## 2022-12-09 DIAGNOSIS — Z1231 Encounter for screening mammogram for malignant neoplasm of breast: Secondary | ICD-10-CM

## 2022-12-31 ENCOUNTER — Other Ambulatory Visit: Payer: Self-pay | Admitting: Acute Care

## 2022-12-31 DIAGNOSIS — F17210 Nicotine dependence, cigarettes, uncomplicated: Secondary | ICD-10-CM

## 2022-12-31 DIAGNOSIS — Z87891 Personal history of nicotine dependence: Secondary | ICD-10-CM

## 2022-12-31 DIAGNOSIS — Z122 Encounter for screening for malignant neoplasm of respiratory organs: Secondary | ICD-10-CM

## 2023-02-02 ENCOUNTER — Ambulatory Visit
Admission: RE | Admit: 2023-02-02 | Discharge: 2023-02-02 | Disposition: A | Discharge status: Home / Self Care | Payer: Medicare Other | Source: Ambulatory Visit | Attending: Acute Care | Admitting: Acute Care

## 2023-02-02 DIAGNOSIS — F17210 Nicotine dependence, cigarettes, uncomplicated: Secondary | ICD-10-CM | POA: Diagnosis not present

## 2023-02-02 DIAGNOSIS — Z122 Encounter for screening for malignant neoplasm of respiratory organs: Secondary | ICD-10-CM

## 2023-02-02 DIAGNOSIS — Z87891 Personal history of nicotine dependence: Secondary | ICD-10-CM

## 2023-02-08 ENCOUNTER — Other Ambulatory Visit: Payer: Self-pay | Admitting: Acute Care

## 2023-02-08 DIAGNOSIS — Z87891 Personal history of nicotine dependence: Secondary | ICD-10-CM

## 2023-02-08 DIAGNOSIS — Z122 Encounter for screening for malignant neoplasm of respiratory organs: Secondary | ICD-10-CM

## 2023-02-08 DIAGNOSIS — F17210 Nicotine dependence, cigarettes, uncomplicated: Secondary | ICD-10-CM

## 2023-04-25 DIAGNOSIS — H53031 Strabismic amblyopia, right eye: Secondary | ICD-10-CM | POA: Diagnosis not present

## 2023-04-25 DIAGNOSIS — H1045 Other chronic allergic conjunctivitis: Secondary | ICD-10-CM | POA: Diagnosis not present

## 2023-04-25 DIAGNOSIS — Q141 Congenital malformation of retina: Secondary | ICD-10-CM | POA: Diagnosis not present

## 2023-04-25 DIAGNOSIS — H2513 Age-related nuclear cataract, bilateral: Secondary | ICD-10-CM | POA: Diagnosis not present

## 2023-05-16 DIAGNOSIS — E7800 Pure hypercholesterolemia, unspecified: Secondary | ICD-10-CM | POA: Diagnosis not present

## 2023-05-16 DIAGNOSIS — I10 Essential (primary) hypertension: Secondary | ICD-10-CM | POA: Diagnosis not present

## 2023-05-16 DIAGNOSIS — Z6820 Body mass index (BMI) 20.0-20.9, adult: Secondary | ICD-10-CM | POA: Diagnosis not present

## 2023-05-16 DIAGNOSIS — F17200 Nicotine dependence, unspecified, uncomplicated: Secondary | ICD-10-CM | POA: Diagnosis not present

## 2023-05-16 DIAGNOSIS — E119 Type 2 diabetes mellitus without complications: Secondary | ICD-10-CM | POA: Diagnosis not present

## 2023-05-16 DIAGNOSIS — Z23 Encounter for immunization: Secondary | ICD-10-CM | POA: Diagnosis not present

## 2023-05-16 DIAGNOSIS — E039 Hypothyroidism, unspecified: Secondary | ICD-10-CM | POA: Diagnosis not present

## 2023-05-16 DIAGNOSIS — R221 Localized swelling, mass and lump, neck: Secondary | ICD-10-CM | POA: Diagnosis not present

## 2023-05-17 ENCOUNTER — Other Ambulatory Visit: Payer: Self-pay | Admitting: Family Medicine

## 2023-05-17 DIAGNOSIS — R221 Localized swelling, mass and lump, neck: Secondary | ICD-10-CM

## 2023-06-01 ENCOUNTER — Ambulatory Visit
Admission: RE | Admit: 2023-06-01 | Discharge: 2023-06-01 | Disposition: A | Discharge status: Home / Self Care | Payer: Medicare Other | Source: Ambulatory Visit | Attending: Family Medicine | Admitting: Family Medicine

## 2023-06-01 DIAGNOSIS — R221 Localized swelling, mass and lump, neck: Secondary | ICD-10-CM | POA: Diagnosis not present

## 2023-06-01 MED ORDER — IOPAMIDOL (ISOVUE-300) INJECTION 61%
200.0000 mL | Freq: Once | INTRAVENOUS | Status: AC | PRN
  Administered 2023-06-01: 75 mL via INTRAVENOUS

## 2023-06-06 IMAGING — MR MR LUMBAR SPINE W/O CM
4 of 5 series · 18 of 48 positions shown · non-contrast
Comparison: None Available.

CLINICAL DATA: Left leg pain

EXAM:
MRI LUMBAR SPINE WITHOUT CONTRAST
TECHNIQUE: Multiplanar, multisequence MR imaging of the lumbar spine was
performed. No intravenous contrast was administered.

[Series 5: T2 · sagittal · 4.0mm · 0.73mm/px · 6 of 15 slices shown (1 of 2)]
[im 1/15]
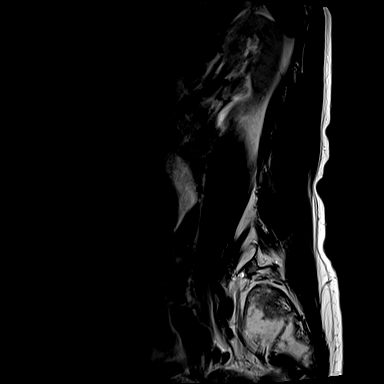
[im 3/15]
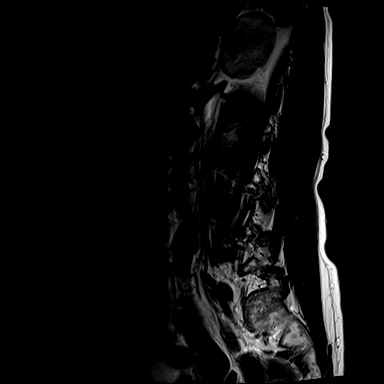
[im 6/15]
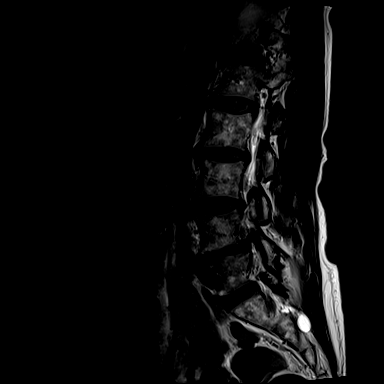
[im 9/15]
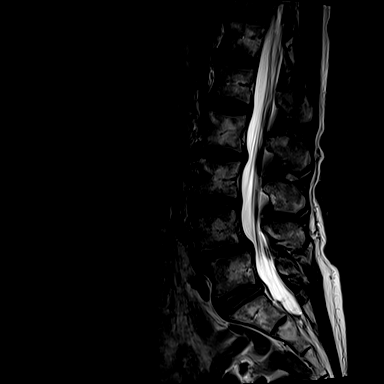
[im 12/15]
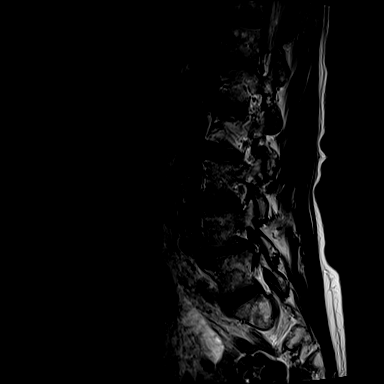
[im 15/15]
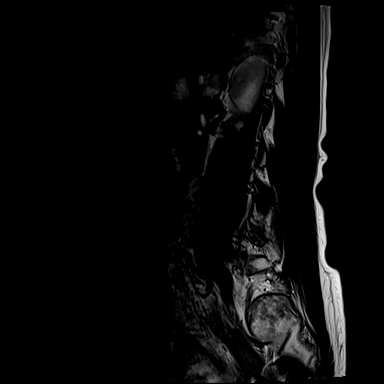

[Series 6: T1 · sagittal · 4.0mm · 0.73mm/px · 3 of 15 slices shown (1 of 2)]
[im 3/15]
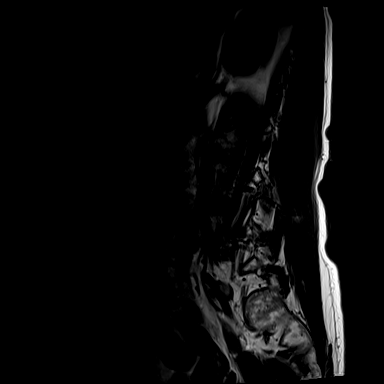
[im 9/15]
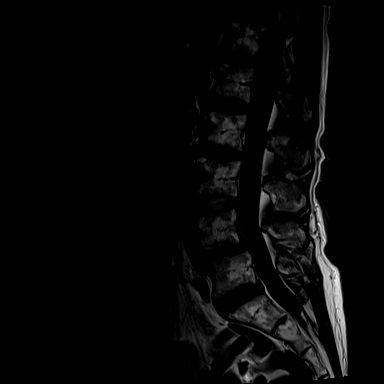
[im 15/15]
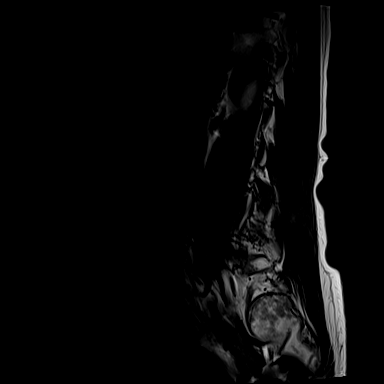

[Series 12: T2 · axial · 4.0mm · 0.28mm/px · z∈[-103,+90]mm · 6 of 39 slices shown (2 of 2)]
[im 1/39]
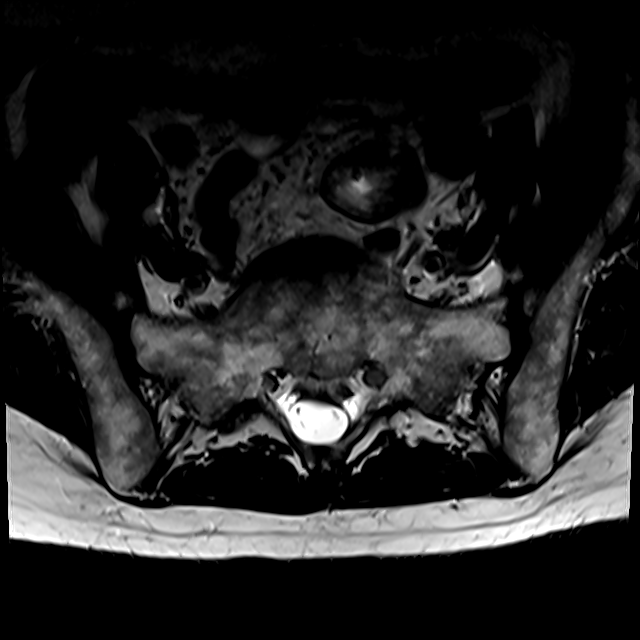
[im 6/39]
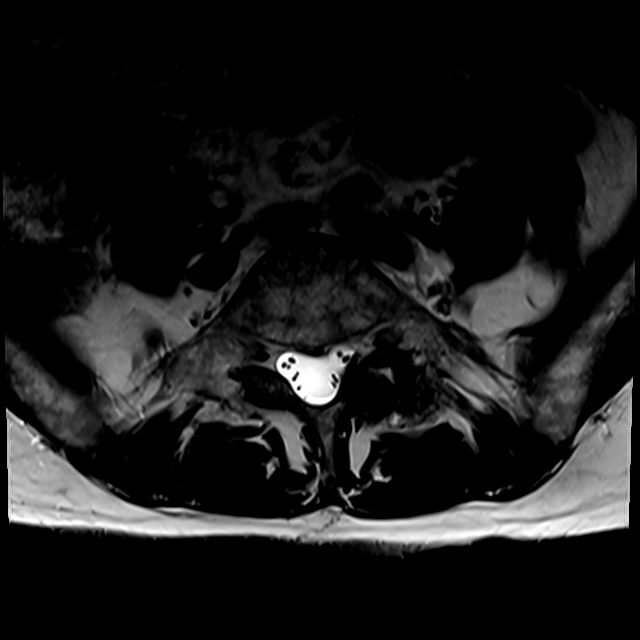
[im 11/39]
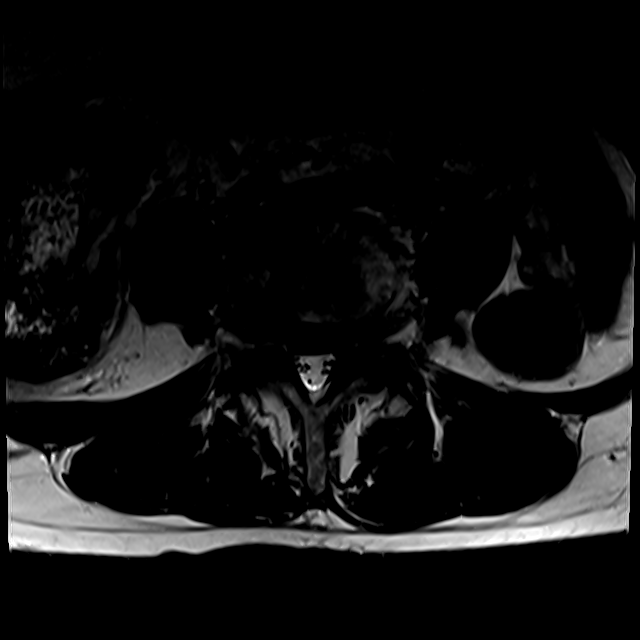
[im 17/39]
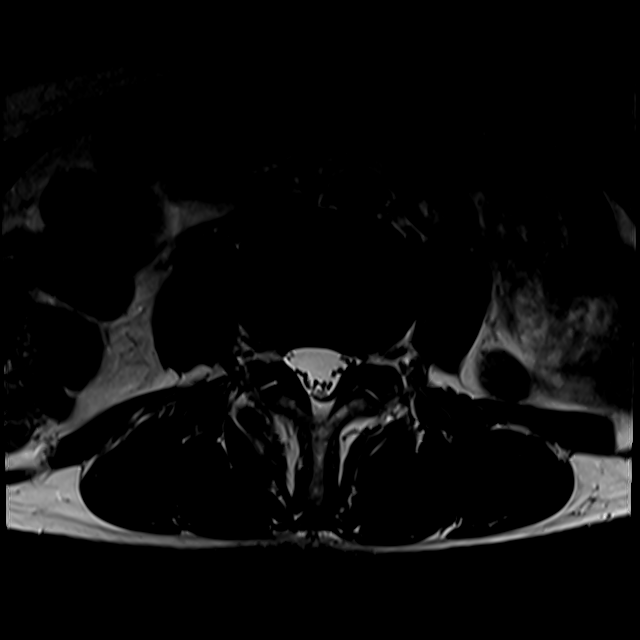
[im 20/39]
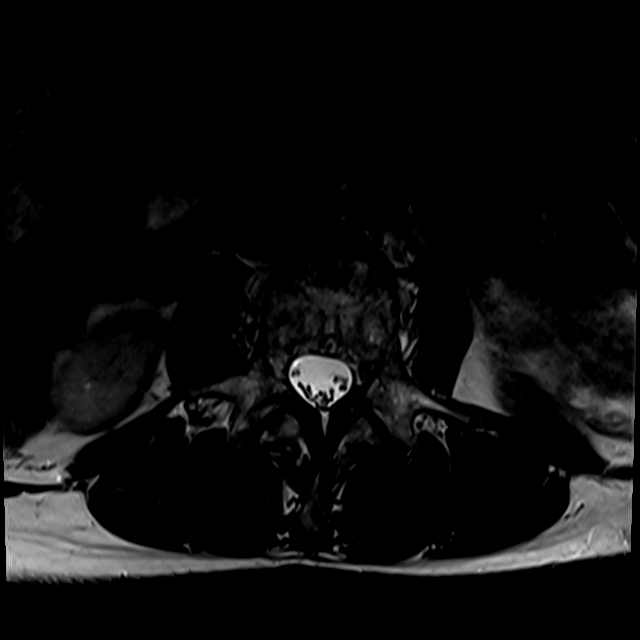
[im 33/39]
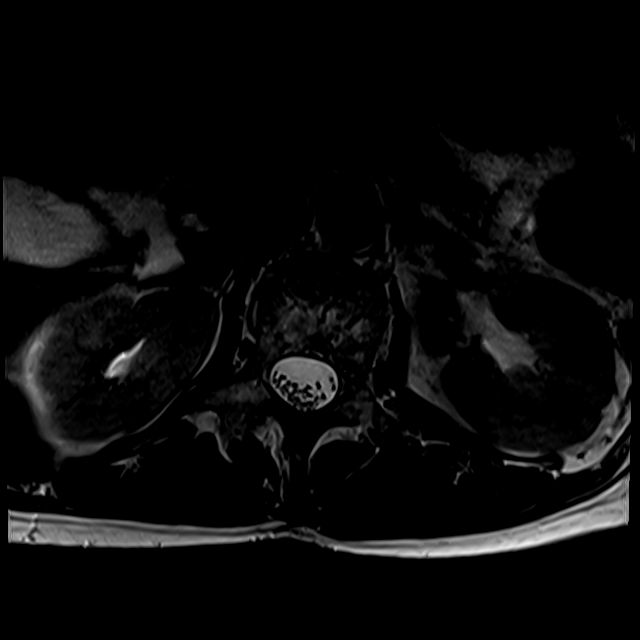

[Series 100: T1 · axial · 4.0mm · 0.28mm/px · z∈[-79,+90]mm · 3 of 39 slices shown (2 of 2)]
[im 6/39]
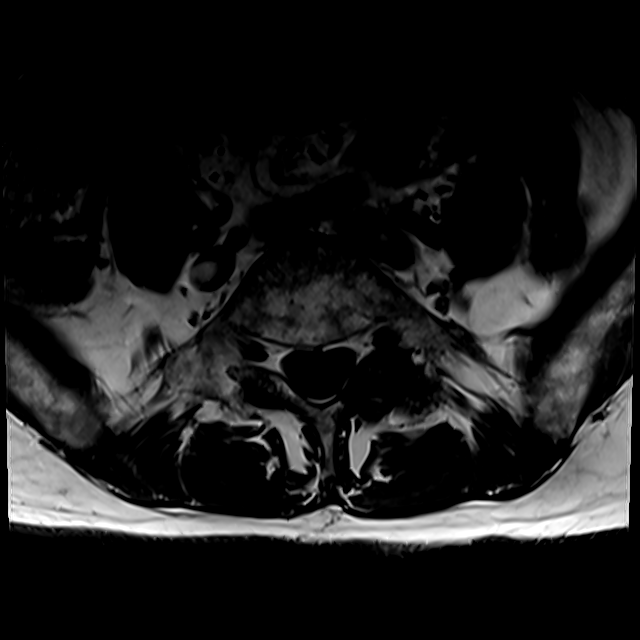
[im 20/39]
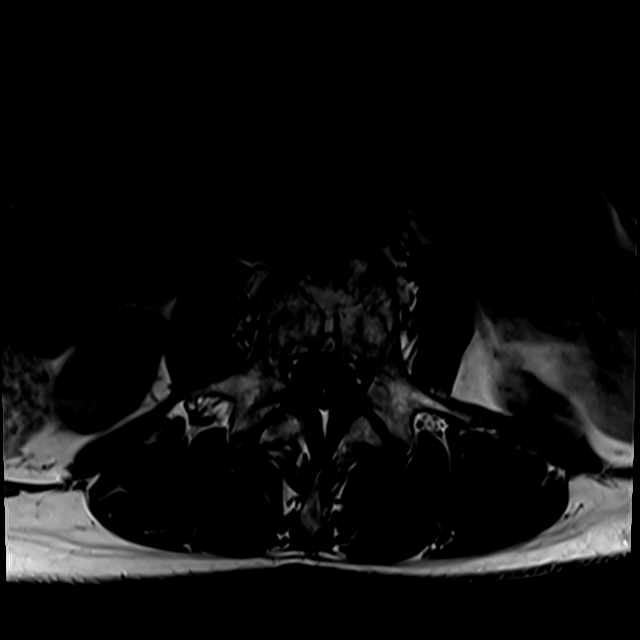
[im 33/39]
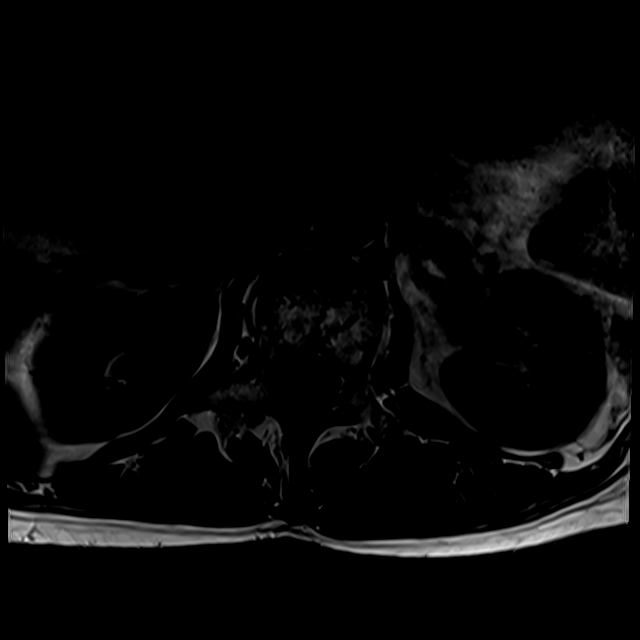

[18 of 48 positions shown; findings below may reference images not displayed]

FINDINGS: Segmentation:  Standard.

Alignment:  Grade 1 anterolisthesis at L4-L5. Levocurvature.

Vertebrae: Marrow signal is heterogeneous, which may reflect osseous
demineralization. Minor degenerative endplate marrow edema. No
suspicious osseous lesion.

Conus medullaris and cauda equina: Conus extends to the T12-L1
level. Conus and cauda equina appear normal.

Paraspinal and other soft tissues: Unremarkable.

Disc levels:

L1-L2:  No canal or foraminal stenosis.

L2-L3:  Disc bulge.  No canal or foraminal stenosis.

L3-L4: Disc bulge. Mild facet arthropathy. No canal or foraminal
stenosis.

L4-L5: Anterolisthesis with uncovering of disc bulge. Facet
arthropathy with ligamentum flavum infolding. Mild canal stenosis.
Partial effacement of the subarticular recesses, right greater than
left. Mild foraminal stenosis.

L5-S1: Left greater than right facet arthropathy. No canal or right
foraminal stenosis. Minor left foraminal stenosis.
IMPRESSION: Multilevel degenerative changes as detailed above without high-grade
stenosis.

## 2023-07-08 DIAGNOSIS — Z6820 Body mass index (BMI) 20.0-20.9, adult: Secondary | ICD-10-CM | POA: Diagnosis not present

## 2023-07-08 DIAGNOSIS — Z23 Encounter for immunization: Secondary | ICD-10-CM | POA: Diagnosis not present

## 2023-07-08 DIAGNOSIS — I10 Essential (primary) hypertension: Secondary | ICD-10-CM | POA: Diagnosis not present

## 2023-07-08 DIAGNOSIS — E7800 Pure hypercholesterolemia, unspecified: Secondary | ICD-10-CM | POA: Diagnosis not present

## 2023-11-14 DIAGNOSIS — E119 Type 2 diabetes mellitus without complications: Secondary | ICD-10-CM | POA: Diagnosis not present

## 2023-11-14 DIAGNOSIS — E039 Hypothyroidism, unspecified: Secondary | ICD-10-CM | POA: Diagnosis not present

## 2023-11-14 DIAGNOSIS — I10 Essential (primary) hypertension: Secondary | ICD-10-CM | POA: Diagnosis not present

## 2023-11-14 DIAGNOSIS — F17210 Nicotine dependence, cigarettes, uncomplicated: Secondary | ICD-10-CM | POA: Diagnosis not present

## 2023-11-14 DIAGNOSIS — E7800 Pure hypercholesterolemia, unspecified: Secondary | ICD-10-CM | POA: Diagnosis not present

## 2024-03-28 DIAGNOSIS — M25512 Pain in left shoulder: Secondary | ICD-10-CM | POA: Diagnosis not present

## 2024-03-28 DIAGNOSIS — M4722 Other spondylosis with radiculopathy, cervical region: Secondary | ICD-10-CM | POA: Diagnosis not present

## 2024-03-28 DIAGNOSIS — M542 Cervicalgia: Secondary | ICD-10-CM | POA: Diagnosis not present

## 2024-03-29 DIAGNOSIS — M47812 Spondylosis without myelopathy or radiculopathy, cervical region: Secondary | ICD-10-CM | POA: Diagnosis not present

## 2024-03-29 DIAGNOSIS — M25512 Pain in left shoulder: Secondary | ICD-10-CM | POA: Diagnosis not present

## 2024-04-09 DIAGNOSIS — M5032 Other cervical disc degeneration, mid-cervical region, unspecified level: Secondary | ICD-10-CM | POA: Diagnosis not present

## 2024-04-16 DIAGNOSIS — M5032 Other cervical disc degeneration, mid-cervical region, unspecified level: Secondary | ICD-10-CM | POA: Diagnosis not present

## 2024-04-23 DIAGNOSIS — M5030 Other cervical disc degeneration, unspecified cervical region: Secondary | ICD-10-CM | POA: Diagnosis not present

## 2024-04-23 DIAGNOSIS — M629 Disorder of muscle, unspecified: Secondary | ICD-10-CM | POA: Diagnosis not present

## 2024-04-30 DIAGNOSIS — M5030 Other cervical disc degeneration, unspecified cervical region: Secondary | ICD-10-CM | POA: Diagnosis not present

## 2024-04-30 DIAGNOSIS — M629 Disorder of muscle, unspecified: Secondary | ICD-10-CM | POA: Diagnosis not present

## 2024-05-01 DIAGNOSIS — M25511 Pain in right shoulder: Secondary | ICD-10-CM | POA: Diagnosis not present

## 2024-05-01 DIAGNOSIS — G8929 Other chronic pain: Secondary | ICD-10-CM | POA: Diagnosis not present

## 2024-05-03 DIAGNOSIS — H53031 Strabismic amblyopia, right eye: Secondary | ICD-10-CM | POA: Diagnosis not present

## 2024-05-03 DIAGNOSIS — Q141 Congenital malformation of retina: Secondary | ICD-10-CM | POA: Diagnosis not present

## 2024-05-03 DIAGNOSIS — H2513 Age-related nuclear cataract, bilateral: Secondary | ICD-10-CM | POA: Diagnosis not present

## 2024-05-03 DIAGNOSIS — H1045 Other chronic allergic conjunctivitis: Secondary | ICD-10-CM | POA: Diagnosis not present

## 2024-05-16 DIAGNOSIS — Z23 Encounter for immunization: Secondary | ICD-10-CM | POA: Diagnosis not present

## 2024-05-21 DIAGNOSIS — E119 Type 2 diabetes mellitus without complications: Secondary | ICD-10-CM | POA: Diagnosis not present

## 2024-05-21 DIAGNOSIS — E039 Hypothyroidism, unspecified: Secondary | ICD-10-CM | POA: Diagnosis not present

## 2024-05-21 DIAGNOSIS — I10 Essential (primary) hypertension: Secondary | ICD-10-CM | POA: Diagnosis not present

## 2024-05-21 DIAGNOSIS — Z23 Encounter for immunization: Secondary | ICD-10-CM | POA: Diagnosis not present

## 2024-05-21 DIAGNOSIS — E7800 Pure hypercholesterolemia, unspecified: Secondary | ICD-10-CM | POA: Diagnosis not present

## 2024-05-21 DIAGNOSIS — Z0000 Encounter for general adult medical examination without abnormal findings: Secondary | ICD-10-CM | POA: Diagnosis not present

## 2024-05-21 DIAGNOSIS — Z1331 Encounter for screening for depression: Secondary | ICD-10-CM | POA: Diagnosis not present

## 2024-05-21 DIAGNOSIS — Z87891 Personal history of nicotine dependence: Secondary | ICD-10-CM | POA: Diagnosis not present

## 2024-05-21 DIAGNOSIS — M810 Age-related osteoporosis without current pathological fracture: Secondary | ICD-10-CM | POA: Diagnosis not present

## 2024-05-22 DIAGNOSIS — M7552 Bursitis of left shoulder: Secondary | ICD-10-CM | POA: Diagnosis not present

## 2024-05-22 DIAGNOSIS — M7551 Bursitis of right shoulder: Secondary | ICD-10-CM | POA: Diagnosis not present

## 2024-05-22 DIAGNOSIS — M542 Cervicalgia: Secondary | ICD-10-CM | POA: Diagnosis not present

## 2024-05-23 ENCOUNTER — Encounter (HOSPITAL_BASED_OUTPATIENT_CLINIC_OR_DEPARTMENT_OTHER): Payer: Self-pay | Admitting: Family Medicine

## 2024-05-23 DIAGNOSIS — M8580 Other specified disorders of bone density and structure, unspecified site: Secondary | ICD-10-CM
